# Patient Record
Sex: Male | Born: 1945 | Race: White | Hispanic: No | Marital: Married | Smoking: Former smoker
Health system: Southern US, Community
[De-identification: ages and names within clinical notes are randomized; demographics above are authoritative.]

## PROBLEM LIST (undated history)

## (undated) DIAGNOSIS — N433 Hydrocele, unspecified: Secondary | ICD-10-CM

## (undated) DIAGNOSIS — Z9989 Dependence on other enabling machines and devices: Secondary | ICD-10-CM

## (undated) DIAGNOSIS — E119 Type 2 diabetes mellitus without complications: Secondary | ICD-10-CM

## (undated) DIAGNOSIS — F419 Anxiety disorder, unspecified: Secondary | ICD-10-CM

## (undated) DIAGNOSIS — M1990 Unspecified osteoarthritis, unspecified site: Secondary | ICD-10-CM

## (undated) DIAGNOSIS — G4733 Obstructive sleep apnea (adult) (pediatric): Secondary | ICD-10-CM

## (undated) DIAGNOSIS — I10 Essential (primary) hypertension: Secondary | ICD-10-CM

## (undated) DIAGNOSIS — F32A Depression, unspecified: Secondary | ICD-10-CM

## (undated) DIAGNOSIS — E669 Obesity, unspecified: Secondary | ICD-10-CM

## (undated) DIAGNOSIS — E785 Hyperlipidemia, unspecified: Secondary | ICD-10-CM

## (undated) DIAGNOSIS — F329 Major depressive disorder, single episode, unspecified: Secondary | ICD-10-CM

## (undated) DIAGNOSIS — J690 Pneumonitis due to inhalation of food and vomit: Secondary | ICD-10-CM

## (undated) DIAGNOSIS — C801 Malignant (primary) neoplasm, unspecified: Secondary | ICD-10-CM

## (undated) DIAGNOSIS — D494 Neoplasm of unspecified behavior of bladder: Secondary | ICD-10-CM

## (undated) DIAGNOSIS — D649 Anemia, unspecified: Secondary | ICD-10-CM

## (undated) HISTORY — PX: EYE SURGERY: SHX253

## (undated) HISTORY — PX: COLONOSCOPY: SHX174

## (undated) HISTORY — PX: LAMINECTOMY: SHX219

---

## 2007-05-06 ENCOUNTER — Ambulatory Visit: Payer: Self-pay | Admitting: Pulmonary Disease

## 2007-05-31 ENCOUNTER — Ambulatory Visit (HOSPITAL_BASED_OUTPATIENT_CLINIC_OR_DEPARTMENT_OTHER): Admission: RE | Admit: 2007-05-31 | Discharge: 2007-05-31 | Payer: Self-pay | Admitting: Pulmonary Disease

## 2007-06-11 ENCOUNTER — Ambulatory Visit: Payer: Self-pay | Admitting: Pulmonary Disease

## 2007-07-01 ENCOUNTER — Ambulatory Visit: Payer: Self-pay | Admitting: Pulmonary Disease

## 2007-07-29 ENCOUNTER — Ambulatory Visit: Payer: Self-pay | Admitting: Pulmonary Disease

## 2007-09-16 ENCOUNTER — Ambulatory Visit: Payer: Self-pay | Admitting: Pulmonary Disease

## 2010-09-26 ENCOUNTER — Encounter: Admission: RE | Admit: 2010-09-26 | Discharge: 2010-09-26 | Payer: Self-pay | Admitting: Family Medicine

## 2011-04-21 NOTE — Assessment & Plan Note (Signed)
[facility]                             PULMONARY OFFICE NOTE   NAME:Trevor David, Trevor David                           MRN:          [id number]  DATE:05/06/2007                            DOB:          1946/09/26    REFERRING PHYSICIAN:  Jetty Duhamel., M.D.   HISTORY OF PRESENT ILLNESS:  The patient is a very pleasant, 65 year old  gentleman who I have been asked to see for obstructive sleep apnea. The  patient states that he has had great difficulty over the last few years  with excessive daytime sleepiness and it has gotten to the point that he  is now falling asleep at work during quiet times and even occasionally  while standing. The patient has significantly decreased energy. He also  states it is almost impossible for him to finish a TV program or a movie  without falling asleep, and also has some sleepiness with driving. The  patient has been told by his wife that he has loud snoring but she has  not mentioned pauses in his breathing during sleep. He has noted a few  choking arousals. Typically he gets to bed between 10 and 11 p.m. and  gets up at 7:45 a.m. to start his day. He is not rested. Of note, the  patient's weight is up about 25 pounds over the last 2 years.   PAST MEDICAL HISTORY:  1. Diabetes.  2. History of anxiety and ADD and is followed by Dr. Jennelle Human.  3. Status post lumbar laminectomy.  4. Status post cataract extraction.   CURRENT MEDICATIONS:  1. Crestor 5 mg daily.  2. Actos 45 mg daily.  3. Citalopram 40 mg daily.  4. Provigil 200 mg daily.  5. Doxazosin 8 mg 2 daily.  6. The patient has no known drug allergies.   SOCIAL HISTORY:  He is married and has children. He has a history of  only 1 pack per day for 23 years. He has not smoked since February 2008.   FAMILY HISTORY:  Remarkable for his mother and father having heart  attacks, otherwise noncontributory.   REVIEW OF SYSTEMS:  As per history of present illness. Also see  patient  intake form documented in the chart.   PHYSICAL EXAMINATION:  GENERAL:  He is an overweight male in no acute  distress.  VITAL SIGNS:  Blood pressure is 124/72, pulse 74, temperature 98.1,  weight is 246 pounds. He is 5 foot 8 inches tall. O2 saturation is 96%  on room air.  HEENT:  Pupils equal round and reactive to light and accommodation.  Extraocular muscles are intact. Nares are patent without discharge.  Oropharynx does show moderate elongation of the soft palate and uvula.  NECK:  Supple JVD or lymphadenopathy. There is no palpable thyromegaly.  CHEST:  Totally clear.  CARDIAC:  Reveals a regular rate and rhythm with no murmurs, rubs or  gallops.  ABDOMEN:  Soft, nontender with good bowel sounds.  GENITAL/RECTAL/BREASTS:  Not done and not indicated.  EXTREMITIES:  The lower extremities are without edema, pulses are intact  distally.  NEUROLOGIC:  He is alert and oriented with no obvious motor deficits.   IMPRESSION:  Probable obstructive sleep apnea that is fairly  significant. The patient gives all of the classic history, and he is  also overweight. I had a long discussion with him about sleep apnea  including the shortterm quality of life issues and the long-term  cardiovascular issues. At this point in time, I really think he needs to  have nocturnal polysomnography. The patient is agreeable to this.   PLAN:  1. Work on weight loss.  2. Scheduled for NPSG.  3. The patient will followup after the above.     Barbaraann Share, MD,FCCP  Electronically Signed    [facility]  DD: 05/06/2007  DT: 05/06/2007  Job #: [id number]   cc:   Jetty Duhamel., M.D.  Alfonse Alpers. Dagoberto Ligas, M.D.

## 2011-04-21 NOTE — Assessment & Plan Note (Signed)
[facility]                             PULMONARY OFFICE NOTE   NAME:Trevor David, Trevor David                           MRN:          [id number]  DATE:07/01/2007                            DOB:          11-Jan-1946    SUBJECTIVE:  Trevor David comes in today for followup of his nocturnal  polysomnogram.  He was found to have severe obstructive sleep apnea with  an apnea-hypopnea index of 86 events per hour and O2 desaturation as low  as 74%.  I have gone over the study in great detail with him and  answered all of his questions.   PHYSICAL EXAMINATION:  GENERAL:  He is an overweight male in no acute  distress.  Blood pressure is 138/72, pulse 78, temperature 98.5, weight  is 245 pounds, O2 saturation on room air is 96%.   IMPRESSION:  Severe obstructive sleep apnea.  I have had a long  discussion with him about the various treatment options for this,  however weight loss, continuous positive airway pressure are the only  effective therapies at this time.  The patient is willing to give  continuous positive airway pressure a shot while he is trying to lose  weight.   PLAN:  1. Initiate CPAP at 10 cm.  He will ultimately need pressure      optimization.  2. Work on weight loss.  3. The patient will follow up in 4 weeks or sooner is there is      problems.     Barbaraann Share, MD,FCCP  Electronically Signed    [facility]  DD: 07/01/2007  DT: 07/02/2007  Job #: [id number]   cc:   Jetty Duhamel., M.D.  Alfonse Alpers. Dagoberto Ligas, M.D.

## 2011-04-21 NOTE — Procedures (Signed)
NAME:  Trevor David, Trevor David NO.:  [id number]   MEDICAL RECORD NO.:  [id number]          PATIENT TYPE:  OUT   LOCATION:  SLEEP CENTER                 FACILITY:  [facility]   PHYSICIAN:  Barbaraann Share, MD,FCCPDATE OF BIRTH:  October 11, 1946   DATE OF STUDY:  05/31/2007                            NOCTURNAL POLYSOMNOGRAM   REFERRING PHYSICIAN:  Barbaraann Share, MD,FCCP   LOCATION:  Sleep Lab.   INDICATION FOR STUDY:  Hypersomnia with sleep apnea.   EPWORTH SLEEPINESS SCORE:  23.   MEDICATIONS:   SLEEP ARCHITECTURE:  The patient had a total sleep time of 293 minutes  with no slow-wave sleep achieved and only 12.5 minutes of REM.  Sleep  onset latency was prolonged at 65 minutes and REM onset was normal.  Sleep efficiency was decreased at 72%.   RESPIRATORY DATA:  The patient was found to have 123 hypopneas and 298  apneas for an apnea-hypopnea index of 86 events per hour.  The events  were not positional and there was loud snoring noted throughout.   OXYGEN DATA:  The patient had O2 desaturation with his obstructive  events as low as 74%.   CARDIAC DATA:  No clinically significant arrhythmias were noted.   MOVEMENT-PARASOMNIA:  None.   IMPRESSIONS-RECOMMENDATIONS:  Severe obstructive sleep apnea/hypopnea  syndrome with an apnea-hypopnea index of 86 events per hour and oxygen  desaturation as low as 84%.  Treatment for this degree of sleep apnea  should focus on weight loss if applicable as well as continuous positive  airway pressure.      Barbaraann Share, MD,FCCP  Diplomate, American Board of Sleep  Medicine  Electronically Signed    [facility]  D:  06/09/2007 18:52:30  T:  06/10/2007 10:55:15  Job:  [id number]

## 2013-12-07 HISTORY — PX: ROTATOR CUFF REPAIR: SHX139

## 2014-10-11 ENCOUNTER — Other Ambulatory Visit: Payer: Self-pay | Admitting: Family Medicine

## 2014-10-11 ENCOUNTER — Ambulatory Visit
Admission: RE | Admit: 2014-10-11 | Discharge: 2014-10-11 | Disposition: A | Discharge status: Home / Self Care | Payer: BC Managed Care – PPO | Source: Ambulatory Visit | Attending: Family Medicine | Admitting: Family Medicine

## 2014-10-11 DIAGNOSIS — J40 Bronchitis, not specified as acute or chronic: Secondary | ICD-10-CM

## 2014-10-23 ENCOUNTER — Other Ambulatory Visit: Payer: Self-pay | Admitting: Family Medicine

## 2014-10-23 DIAGNOSIS — R0989 Other specified symptoms and signs involving the circulatory and respiratory systems: Secondary | ICD-10-CM

## 2014-10-31 ENCOUNTER — Ambulatory Visit
Admission: RE | Admit: 2014-10-31 | Discharge: 2014-10-31 | Disposition: A | Discharge status: Home / Self Care | Payer: BC Managed Care – PPO | Source: Ambulatory Visit | Attending: Family Medicine | Admitting: Family Medicine

## 2014-10-31 DIAGNOSIS — R0989 Other specified symptoms and signs involving the circulatory and respiratory systems: Secondary | ICD-10-CM

## 2014-12-05 ENCOUNTER — Other Ambulatory Visit: Payer: Self-pay | Admitting: Family Medicine

## 2014-12-05 ENCOUNTER — Ambulatory Visit
Admission: RE | Admit: 2014-12-05 | Discharge: 2014-12-05 | Disposition: A | Discharge status: Home / Self Care | Payer: BC Managed Care – PPO | Source: Ambulatory Visit | Attending: Family Medicine | Admitting: Family Medicine

## 2014-12-05 DIAGNOSIS — J189 Pneumonia, unspecified organism: Secondary | ICD-10-CM

## 2017-03-01 ENCOUNTER — Encounter: Payer: Self-pay | Admitting: Internal Medicine

## 2017-03-01 ENCOUNTER — Ambulatory Visit (INDEPENDENT_AMBULATORY_CARE_PROVIDER_SITE_OTHER): Payer: BC Managed Care – PPO | Admitting: Internal Medicine

## 2017-03-01 VITALS — BP 118/74 | HR 72 | Ht 68.0 in | Wt 262.3 lb

## 2017-03-01 DIAGNOSIS — G4726 Circadian rhythm sleep disorder, shift work type: Secondary | ICD-10-CM

## 2017-03-01 DIAGNOSIS — G4733 Obstructive sleep apnea (adult) (pediatric): Secondary | ICD-10-CM | POA: Diagnosis not present

## 2017-03-01 DIAGNOSIS — E6609 Other obesity due to excess calories: Secondary | ICD-10-CM

## 2017-03-01 NOTE — Patient Instructions (Signed)
Order- schedule sleep study- unattended home study     Dx OSA  Please call as needed

## 2017-03-01 NOTE — Progress Notes (Signed)
03/01/2017- 71 year old male married smoker with history of Obstructive sleep apnea -[facility] patient of [place]; had attempted to use CPAP in the past-unable to tolerate-mask fitting issues. Now referred back by Dr. Lynder Parents who has help with anxiety.   Medical problems include DM 2,  NPSG 05/31/07- severe OSA-AHI 86/hour, oxygen desaturation to 84%. Weight 245 pounds Initial CPAP trial was at 10 CWP Plans to retire in August but has worked a changing shift schedule which has contributed to his difficulties. 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 For complaints of "low energy and falling asleep" he had tried Ritalin and Adderall in the past. He works alternating shifts. Works 7 AM until 12 PM Told he snores loudly and raises his arm and his sleep. ENT surgery-none. He is not sure thyroid status. History of back pain requiring laminectomy. Uses occasional OTC sleep aid-NyQuil or Zzquil. Caffeine 2 or 3 cups in the morning.  Prior to Admission medications   Medication Sig Start Date End Date Taking? Authorizing Provider  Armodafinil 250 MG tablet Take 1 tablet by mouth daily. 01/11/17  Yes Historical Provider, MD  citalopram (CELEXA) 20 MG tablet Take 1 tablet by mouth daily. 12/29/16  Yes Historical Provider, MD  doxazosin (CARDURA) 8 MG tablet Take 1 tablet by mouth daily. 02/23/17  Yes Historical Provider, MD  JANUMET 50-1000 MG tablet Take 1 tablet by mouth 2 (two) times daily. 02/08/17  Yes Historical Provider, MD  lisinopril (PRINIVIL,ZESTRIL) 5 MG tablet Take 1 tablet by mouth daily. 01/30/17  Yes Historical Provider, MD  pioglitazone (ACTOS) 45 MG tablet Take 1 tablet by mouth daily. 01/30/17  Yes Historical Provider, MD  rosuvastatin (CRESTOR) 5 MG tablet Take 1 tablet by mouth daily. 02/08/17  Yes Historical Provider, MD   No past medical history on file. No past surgical history on  file. No family history on file. Social History   Social History  . Marital status: Married    Spouse name: N/A  . Number of children: N/A  . Years of education: N/A   Occupational History  . Not on file.   Social History Main Topics  . Smoking status: Current Every Day Smoker    Packs/day: 0.50    Types: Cigarettes  . Smokeless tobacco: Never Used  . Alcohol use Not on file  . Drug use: Unknown  . Sexual activity: Not on file   Other Topics Concern  . Not on file   Social History Narrative  . No narrative on file   ROS-see HPI   Negative unless "+" Constitutional:    weight loss, night sweats, fevers, chills, + fatigue, lassitude. HEENT:    headaches, difficulty swallowing, tooth/dental problems, sore throat,       sneezing, itching, ear ache, nasal congestion, post nasal drip, snoring CV:    chest pain, orthopnea, PND, swelling in lower extremities, anasarca,                                                 dizziness, palpitations Resp:   shortness of breath with exertion or at rest.                productive cough,   non-productive cough, coughing up of blood.  change in color of mucus.  wheezing.   Skin:    rash or lesions. GI:  No-   heartburn, indigestion, abdominal pain, nausea, vomiting, diarrhea,                 change in bowel habits, loss of appetite GU: dysuria, change in color of urine, no urgency or frequency.   flank pain. MS:   joint pain, stiffness, decreased range of motion, back pain. Neuro-     nothing unusual Psych:  change in mood or affect.  depression or anxiety.   memory loss.  OBJ- Physical Exam           + obese General- Alert, Oriented, Affect-appropriate, Distress- none acute Skin- rash-none, lesions- none, excoriation- none Lymphadenopathy- none Head- atraumatic            Eyes- Gross vision intact, PERRLA, conjunctivae and secretions clear            Ears- Hearing, canals-normal            Nose- Clear, no-Septal dev, mucus,  polyps, erosion, perforation             Throat- Mallampati II-III , mucosa clear , drainage- none, tonsils- atrophic Neck- flexible , trachea midline, no stridor , thyroid nl, carotid no bruit Chest - symmetrical excursion , unlabored           Heart/CV- RRR , no murmur , no gallop  , no rub, nl s1 s2                           - JVD- none , edema- none, stasis changes- none, varices- none           [place]- clear to P&A, wheeze- none, cough- none , dullness-none, rub- none           Chest wall-  Abd-  Br/ Gen/ Rectal- Not done, not indicated Extrem- cyanosis- none, clubbing, none, atrophy- none, strength- nl Neuro- grossly intact to observation

## 2017-03-06 DIAGNOSIS — E669 Obesity, unspecified: Secondary | ICD-10-CM | POA: Insufficient documentation

## 2017-03-06 DIAGNOSIS — G4733 Obstructive sleep apnea (adult) (pediatric): Secondary | ICD-10-CM | POA: Insufficient documentation

## 2017-03-06 DIAGNOSIS — G4726 Circadian rhythm sleep disorder, shift work type: Secondary | ICD-10-CM | POA: Insufficient documentation

## 2017-03-06 NOTE — Assessment & Plan Note (Signed)
He works long/late shifts when he is on. This is likely to contribute to fatigue as discussed.

## 2017-03-06 NOTE — Assessment & Plan Note (Signed)
Goal of normal weight was discussed as ordered for control of sleep apnea. He would also significantly help his diabetes.

## 2017-03-06 NOTE — Assessment & Plan Note (Signed)
We need to document current status. We discussed CPAP with neuro-machines and masks as an anticipated first option. Other considerations will be based on  sleep study and further discussion. Importance of weight loss and his responsibility to be safe driving or emphasized.

## 2017-03-15 DIAGNOSIS — G4733 Obstructive sleep apnea (adult) (pediatric): Secondary | ICD-10-CM | POA: Diagnosis not present

## 2017-03-23 DIAGNOSIS — G4733 Obstructive sleep apnea (adult) (pediatric): Secondary | ICD-10-CM | POA: Diagnosis not present

## 2017-03-24 ENCOUNTER — Other Ambulatory Visit: Payer: Self-pay | Admitting: *Deleted

## 2017-03-24 DIAGNOSIS — G4733 Obstructive sleep apnea (adult) (pediatric): Secondary | ICD-10-CM

## 2017-03-29 ENCOUNTER — Ambulatory Visit: Payer: BC Managed Care – PPO | Admitting: Internal Medicine

## 2017-03-31 ENCOUNTER — Encounter: Payer: Self-pay | Admitting: Internal Medicine

## 2017-03-31 ENCOUNTER — Ambulatory Visit (INDEPENDENT_AMBULATORY_CARE_PROVIDER_SITE_OTHER): Payer: BC Managed Care – PPO | Admitting: Internal Medicine

## 2017-03-31 VITALS — BP 130/78 | HR 77 | Ht 68.0 in | Wt 259.4 lb

## 2017-03-31 DIAGNOSIS — G4733 Obstructive sleep apnea (adult) (pediatric): Secondary | ICD-10-CM

## 2017-03-31 DIAGNOSIS — G4726 Circadian rhythm sleep disorder, shift work type: Secondary | ICD-10-CM

## 2017-03-31 DIAGNOSIS — Z6835 Body mass index (BMI) 35.0-35.9, adult: Secondary | ICD-10-CM

## 2017-03-31 MED ORDER — ZALEPLON 5 MG PO CAPS: ORAL_CAPSULE | ORAL | 3 refills | Status: DC

## 2017-03-31 NOTE — Assessment & Plan Note (Signed)
This is linked to his job and backward change soon. Insomnia component was discussed. Sleep hygiene education done. Plan-try a short-half-life sleep aid: Sonata with appropriate usage discussed.

## 2017-03-31 NOTE — Assessment & Plan Note (Signed)
Weight loss would help significantly. Hopefully with better control of sleep status he may find opportunity to make the appropriate lifestyle changes.

## 2017-03-31 NOTE — Patient Instructions (Signed)
Order- new DME   New CPAP   Auto 5-20, mask of choice, humidifier, supplies, AirView  Dx OSA  Script printed for Sonata 5 mg to take 1 or 2 caps a little before bedtime for sleep as needed  Please call as needed
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
## 2017-03-31 NOTE — Assessment & Plan Note (Signed)
Severe obstructive sleep apnea. Education done. Plan-CPAP with auto 5-20

## 2017-05-13 ENCOUNTER — Encounter: Payer: Self-pay | Admitting: Internal Medicine

## 2017-05-27 ENCOUNTER — Ambulatory Visit
Admission: RE | Admit: 2017-05-27 | Discharge: 2017-05-27 | Disposition: A | Discharge status: Home / Self Care | Payer: BC Managed Care – PPO | Source: Ambulatory Visit | Attending: Family Medicine | Admitting: Family Medicine

## 2017-05-27 ENCOUNTER — Other Ambulatory Visit: Payer: Self-pay | Admitting: Family Medicine

## 2017-05-27 DIAGNOSIS — R05 Cough: Secondary | ICD-10-CM

## 2017-05-27 DIAGNOSIS — R059 Cough, unspecified: Secondary | ICD-10-CM

## 2017-06-08 ENCOUNTER — Ambulatory Visit: Payer: BC Managed Care – PPO | Admitting: Internal Medicine

## 2017-06-30 ENCOUNTER — Ambulatory Visit: Payer: BC Managed Care – PPO | Admitting: Internal Medicine

## 2017-07-12 ENCOUNTER — Encounter: Payer: Self-pay | Admitting: Internal Medicine

## 2017-09-19 ENCOUNTER — Encounter: Payer: Self-pay | Admitting: Internal Medicine

## 2017-09-20 ENCOUNTER — Encounter: Payer: Self-pay | Admitting: Internal Medicine

## 2017-09-20 ENCOUNTER — Ambulatory Visit (INDEPENDENT_AMBULATORY_CARE_PROVIDER_SITE_OTHER): Payer: Medicare Other | Admitting: Internal Medicine

## 2017-09-20 DIAGNOSIS — Z23 Encounter for immunization: Secondary | ICD-10-CM | POA: Diagnosis not present

## 2017-09-20 DIAGNOSIS — G4733 Obstructive sleep apnea (adult) (pediatric): Secondary | ICD-10-CM | POA: Diagnosis not present

## 2017-09-20 DIAGNOSIS — G4726 Circadian rhythm sleep disorder, shift work type: Secondary | ICD-10-CM | POA: Diagnosis not present

## 2017-09-20 MED ORDER — CLONAZEPAM 0.5 MG PO TABS: ORAL_TABLET | ORAL | 2 refills | Status: DC

## 2017-09-20 NOTE — Assessment & Plan Note (Signed)
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

## 2017-09-20 NOTE — Assessment & Plan Note (Addendum)
He is never been easy and comfortable with CPAP. We discussed comfort issues and goals. Shift work is no longer part of the problem. Plan-continue CPAP auto 5-20/ Advanced Emphasis on comfort. Try clonazepam instead of Sonata for insomnia component.
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
## 2017-09-20 NOTE — Patient Instructions (Signed)
Script printed for clonazepam      Instead of Sonata, try 1-2 tabs about 30 minutes before  bedtime, for sleep as needed   Please try to get the comfort issues with CPAP worked out.- We can work with more mask styles if that will help  Flu vax- senior

## 2017-09-22 DIAGNOSIS — H401131 Primary open-angle glaucoma, bilateral, mild stage: Secondary | ICD-10-CM | POA: Diagnosis not present

## 2017-09-22 DIAGNOSIS — H524 Presbyopia: Secondary | ICD-10-CM | POA: Diagnosis not present

## 2017-09-22 DIAGNOSIS — H26492 Other secondary cataract, left eye: Secondary | ICD-10-CM | POA: Diagnosis not present

## 2017-09-22 DIAGNOSIS — H5213 Myopia, bilateral: Secondary | ICD-10-CM | POA: Diagnosis not present

## 2017-09-22 DIAGNOSIS — H52223 Regular astigmatism, bilateral: Secondary | ICD-10-CM | POA: Diagnosis not present

## 2017-09-22 DIAGNOSIS — E119 Type 2 diabetes mellitus without complications: Secondary | ICD-10-CM | POA: Diagnosis not present

## 2018-01-12 DIAGNOSIS — F419 Anxiety disorder, unspecified: Secondary | ICD-10-CM | POA: Diagnosis not present

## 2018-01-19 DIAGNOSIS — E119 Type 2 diabetes mellitus without complications: Secondary | ICD-10-CM | POA: Diagnosis not present

## 2018-01-19 DIAGNOSIS — F324 Major depressive disorder, single episode, in partial remission: Secondary | ICD-10-CM | POA: Diagnosis not present

## 2018-01-19 DIAGNOSIS — E7800 Pure hypercholesterolemia, unspecified: Secondary | ICD-10-CM | POA: Diagnosis not present

## 2018-01-19 DIAGNOSIS — I10 Essential (primary) hypertension: Secondary | ICD-10-CM | POA: Diagnosis not present

## 2018-01-20 ENCOUNTER — Encounter: Payer: Self-pay | Admitting: Internal Medicine

## 2018-01-21 ENCOUNTER — Ambulatory Visit (INDEPENDENT_AMBULATORY_CARE_PROVIDER_SITE_OTHER): Payer: Medicare Other | Admitting: Internal Medicine

## 2018-01-21 ENCOUNTER — Encounter: Payer: Self-pay | Admitting: Internal Medicine

## 2018-01-21 DIAGNOSIS — Z720 Tobacco use: Secondary | ICD-10-CM | POA: Insufficient documentation

## 2018-01-21 DIAGNOSIS — G4733 Obstructive sleep apnea (adult) (pediatric): Secondary | ICD-10-CM | POA: Diagnosis not present
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
## 2018-01-21 NOTE — Assessment & Plan Note (Signed)
We reviewed compliance download.  We think he can do better as discussed.  Pressure range is fine.  He is going to check his manual for instructions to adjust his humidifier for comfort. Plan-continue auto 5-20.  Adjust humidifier.  Encourage weight loss.  Replace old mask for better seal.

## 2018-01-21 NOTE — Assessment & Plan Note (Signed)
Despite clear chest x-ray and lack of symptoms, I explained his continued smoking is likely to cause him problems and he is strongly encouraged to quit completely.

## 2018-01-21 NOTE — Assessment & Plan Note (Signed)
Weight loss is encouraged 

## 2018-01-21 NOTE — Patient Instructions (Signed)
Ok to continue CPAP auto 5-20, mask of choice, humidifier, supplies, AirView  Please call if we can help

## 2018-01-25 DIAGNOSIS — H401131 Primary open-angle glaucoma, bilateral, mild stage: Secondary | ICD-10-CM | POA: Diagnosis not present

## 2018-02-22 DIAGNOSIS — R300 Dysuria: Secondary | ICD-10-CM | POA: Diagnosis not present

## 2018-03-01 DIAGNOSIS — D485 Neoplasm of uncertain behavior of skin: Secondary | ICD-10-CM | POA: Diagnosis not present

## 2018-03-01 DIAGNOSIS — L570 Actinic keratosis: Secondary | ICD-10-CM | POA: Diagnosis not present

## 2018-03-01 DIAGNOSIS — L821 Other seborrheic keratosis: Secondary | ICD-10-CM | POA: Diagnosis not present

## 2018-03-01 DIAGNOSIS — L218 Other seborrheic dermatitis: Secondary | ICD-10-CM | POA: Diagnosis not present

## 2018-03-04 DIAGNOSIS — R310 Gross hematuria: Secondary | ICD-10-CM | POA: Diagnosis not present

## 2018-03-11 DIAGNOSIS — R310 Gross hematuria: Secondary | ICD-10-CM | POA: Diagnosis not present

## 2018-03-11 DIAGNOSIS — R3129 Other microscopic hematuria: Secondary | ICD-10-CM | POA: Diagnosis not present

## 2018-03-16 DIAGNOSIS — D414 Neoplasm of uncertain behavior of bladder: Secondary | ICD-10-CM | POA: Diagnosis not present

## 2018-03-16 DIAGNOSIS — R310 Gross hematuria: Secondary | ICD-10-CM | POA: Diagnosis not present

## 2018-03-23 ENCOUNTER — Other Ambulatory Visit: Payer: Self-pay | Admitting: Urology

## 2018-04-08 ENCOUNTER — Encounter (HOSPITAL_BASED_OUTPATIENT_CLINIC_OR_DEPARTMENT_OTHER): Payer: Self-pay

## 2018-04-11 ENCOUNTER — Encounter (HOSPITAL_BASED_OUTPATIENT_CLINIC_OR_DEPARTMENT_OTHER): Payer: Self-pay

## 2018-04-11 ENCOUNTER — Other Ambulatory Visit: Payer: Self-pay

## 2018-04-11 NOTE — Progress Notes (Signed)
Spoke with:  Jeneen Rinks NPO:  After Midnight, no gum, candy, or mints  No smoking Arrival time:  0930AM Labs: Istat8, EKG AM medications: Citalopram, Rosuvastatin, Nuvigil Pre op orders: Yes Ride home:  [name] (wife) [phone]

## 2018-04-14 NOTE — H&P (Signed)
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  He first noticed the symptoms approximately 02/18/2018. He did see the blood in his urine. He has seen blood clots.   He does not have a burning sensation when he urinates. He is not currently having trouble urinating.   He has not had kidney stones. He is not having pain. He has not recently had unwanted weight loss.   03/04/18: He reports that he 1st started having some mild urethral discomfort and cloudy discoloration of the urine about 2 weeks ago. He was started on empiric antibiotics and a culture was found to be negative. He recently had a few clots pass at the initiation of his urinary stream. That occurred again this morning. He said he has over the years had some mild urgency and occasional episodes of urge incontinence but he says this is intermittent and he does not get up at night to urinate. He takes a daily aspirin. He has been a cigarette smoker and continues to smoke. There is no family history of GU malignancy. 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   03/16/18: He said he has seen a little bit more blood in the urine. He has undergone upper tract evaluation of the CT scan in returns today for completion of his evaluation with cystoscopy.     ALLERGIES: None   MEDICATIONS: Cardura 8 mg tablet  Crestor 5 mg tablet  Lisinopril 5 mg tablet  Celexa 20 mg tablet  Janumet 50 mg-1,000 mg tablet     GU PSH: Locm 300-399Mg /Ml Iodine,1Ml - 03/11/2018    NON-GU PSH: Lumbar Laminectomy, Right Rotator Cuff Surgery.., Left    GU PMH: Encounter for Prostate Cancer screening, His prostate was noted to be benign. It has been 2 years since his last PSA so that will be checked as well today. - 03/04/2018 Gross hematuria, He has experienced gross hematuria and has been and continues to be a cigarette smoker. I therefore have recommended a full hematuria evaluation with upper tract  imaging and cystoscopy. I will check a creatinine today. - 03/04/2018    NON-GU PMH: Anxiety Depression Diabetes Type 2 Glaucoma Gout Sleep Apnea    FAMILY HISTORY: Myocardial Infarction - Mother, Father   SOCIAL HISTORY: Marital Status: Married Preferred Language: English; Ethnicity: Not Hispanic Or Latino; Race: White Current Smoking Status: Patient smokes. Has smoked since 02/05/1968. Smokes 1 pack per day.   Tobacco Use Assessment Completed: Used Tobacco in last 30 days? Drinks 4+ caffeinated drinks per day.    REVIEW OF SYSTEMS:    GU Review Male:   Patient denies frequent urination, hard to postpone urination, burning/ pain with urination, get up at night to urinate, leakage of urine, stream starts and stops, trouble starting your stream, have to strain to urinate , erection problems, and penile pain.  Gastrointestinal (Upper):   Patient denies nausea, vomiting, and indigestion/ heartburn.  Gastrointestinal (Lower):   Patient denies diarrhea and constipation.  Constitutional:   Patient denies fever, night sweats, weight loss, and fatigue.  Skin:   Patient denies skin rash/ lesion and itching.  Eyes:   Patient denies blurred vision and double vision.  Ears/ Nose/ Throat:   Patient denies sore throat and sinus problems.  Hematologic/Lymphatic:   Patient denies swollen glands and easy bruising.  Cardiovascular:   Patient denies leg swelling and chest pains.  Respiratory:   Patient denies cough and shortness of breath.  Endocrine:  Patient denies excessive thirst.  Musculoskeletal:   Patient denies back pain and joint pain.  Neurological:   Patient denies headaches and dizziness.  Psychologic:   Patient denies depression and anxiety.   VITAL SIGNS:      03/16/2018 09:25 AM  Weight 255 lb / 115.67 kg  Height 68 in / 172.72 cm  BP 131/62 mmHg  Pulse 80 /min  BMI 38.8 kg/m   GU PHYSICAL EXAMINATION:    Urethral Meatus: Normal size. No lesion, no wart, no discharge, no  polyp. Normal location.  Penis: Circumcised, no warts, no cracks. No dorsal Peyronie's plaques, no left corporal Peyronie's plaques, no right corporal Peyronie's plaques, no scarring, no warts. No balanitis, no meatal stenosis.   MULTI-SYSTEM PHYSICAL EXAMINATION:       PAST DATA REVIEWED:  Source Of History:  Patient  Lab Test Review:   BUN/Creatinine  Records Review:   Previous Patient Records, POC Tool  X-Ray Review: C.T. Hematuria: Reviewed Films. Reviewed Report. Discussed With Patient. EXAM: CT ABDOMEN AND PELVIS WITHOUT AND WITH CONTRAST TECHNIQUE: Multidetector CT imaging of the abdomen and pelvis was performed following the standard protocol before and following the bolus administration of intravenous contrast. CONTRAST: 125 cc Isovue-300 COMPARISON: None. FINDINGS: Lower chest: Linear scarring or subsegmental atelectasis in the lingula and posterior basal segment right lower lobe. Right coronary artery atherosclerotic calcification. Hepatobiliary: Punctate calcifications in the left hepatic lobe compatible with remote granulomatous disease. Gallbladder unremarkable. No biliary dilatation. Pancreas: Unremarkable Spleen: Punctate calcifications compatible with old granulomatous disease. Adrenals/Urinary Tract: Adrenal glands normal. No urinary tract calculi are observed. No appreciable abnormal renal parenchymal enhancement. Polypoid 1.4 by 1.2 by 1.1 cm filling defect in the left posterior urinary bladder just above the left UPJ appears to have accentuated enhancement on image 71/5, favoring transitional cell carcinoma. No other filling defect along the urothelium is identified. Stomach/Bowel: The duodenum does not promptly cross the midline and distally does not extend up to the level of the duodenal bulb, appearance favoring malrotation. Orientation of the colon appears normal. Clustered ascending colon diverticula. Sigmoid colon diverticulosis. No dilated bowel. Vascular/Lymphatic: Aortoiliac  atherosclerotic vascular disease. No pathologic pelvic adenopathy is observed. Reproductive: Unremarkable Other: No supplemental non-categorized findings. Musculoskeletal: Fused sacroiliac joints. Lower lumbar spondylosis with bridging fusion at L5-S1 and bilateral foraminal impingement at L5-S1 secondary to spurring. IMPRESSION: 1. 1.4 cm polypoid mass along the left posterior urinary bladder appears to enhance. Appearance favors transitional cell carcinoma. No other filling defects along the urothelium identified. 2. Other imaging findings of potential clinical significance: Old granulomatous disease. 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 Sigmoid colon diverticulosis and clustered diverticula also in the ascending colon. Aortic Atherosclerosis (ICD10-I70.0). Fused sacroiliac joints. Bilateral foraminal impingement at L5-S1 due to spurring.     03/04/18  PSA  Total PSA 0.16 ng/mL    03/04/18  General Chemistry  Creatinine 0.7 mg/dL   PROCEDURES:         Flexible Cystoscopy - done 03/16/18 Risks, benefits, and some of the potential complications of the procedure were discussed at length with the patient including infection, bleeding, voiding discomfort, urinary retention, fever, chills, sepsis, and others. All questions were answered. Informed consent was obtained. Sterile technique and 2% Lidocaine intraurethral analgesia were used.  Meatus:  Normal size. Normal location. Normal condition.  Urethra:  No strictures.  External Sphincter:  Normal.  Verumontanum:  Normal.  Prostate:  Borderline obstructing. Mild hyperplasia.  Bladder Neck:  Non-obstructing.  Ureteral Orifices:  Normal location. Normal  size. Normal shape. Effluxed clear urine.  Bladder:  Mild trabeculation. 1 1/2 cm tumor. Solitary tumor. Normal mucosa. No stones.      The lower urinary tract was carefully examined. The procedure was well-tolerated and without complications. Instructions were given to call the  office immediately for bloody urine, difficulty urinating, urinary retention, painful or frequent urination, fever or other illness. The patient stated that he understood these instructions and would comply with them.         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  2   Gross hematuria - R31.0 Stable - His hematuria is clearly secondary to the bladder tumor identified.          Notes:   I went over the results of the CT scan as well as my cystoscopic findings today which have revealed a bladder mass consistent with transitional cell carcinoma. We discussed the fact that currently there is no evidence of extravesical extension or pelvic adenopathy based on CT scan findings. Further characterization of the lesion is required for grading and staging purposes. We discussed proceeding with evaluation using transurethral resection of the lesion. I have discussed the procedure in detail as well as the potential risks and complications associated with this form of surgery. We also discussed the probability of successful resection of the intravesical portion of this lesion. I have recommended, as long as there is no contraindication at the time of surgery, the placement of intravesical mitomycin-C in order to reduce the risk of recurrence. We did discuss the  potential side effects of this form of intravesical chemotherapy. The procedure will be performed under anesthesia as an outpatient.

## 2018-04-15 ENCOUNTER — Ambulatory Visit (HOSPITAL_BASED_OUTPATIENT_CLINIC_OR_DEPARTMENT_OTHER): Payer: Medicare Other | Admitting: Certified Registered"

## 2018-04-15 ENCOUNTER — Encounter (HOSPITAL_BASED_OUTPATIENT_CLINIC_OR_DEPARTMENT_OTHER)
Admission: RE | Disposition: A | Discharge status: Home / Self Care | Payer: Self-pay | Source: Ambulatory Visit | Attending: Urology

## 2018-04-15 ENCOUNTER — Ambulatory Visit (HOSPITAL_BASED_OUTPATIENT_CLINIC_OR_DEPARTMENT_OTHER)
Admission: RE | Admit: 2018-04-15 | Discharge: 2018-04-15 | Disposition: A | Discharge status: Home / Self Care | Payer: Medicare Other | Source: Ambulatory Visit | Attending: Urology | Admitting: Urology

## 2018-04-15 ENCOUNTER — Encounter (HOSPITAL_BASED_OUTPATIENT_CLINIC_OR_DEPARTMENT_OTHER): Payer: Self-pay | Admitting: Certified Registered"

## 2018-04-15 DIAGNOSIS — G4730 Sleep apnea, unspecified: Secondary | ICD-10-CM | POA: Insufficient documentation

## 2018-04-15 DIAGNOSIS — G4733 Obstructive sleep apnea (adult) (pediatric): Secondary | ICD-10-CM | POA: Diagnosis not present

## 2018-04-15 DIAGNOSIS — Z7984 Long term (current) use of oral hypoglycemic drugs: Secondary | ICD-10-CM | POA: Insufficient documentation

## 2018-04-15 DIAGNOSIS — D494 Neoplasm of unspecified behavior of bladder: Secondary | ICD-10-CM | POA: Diagnosis not present

## 2018-04-15 DIAGNOSIS — Z6838 Body mass index (BMI) 38.0-38.9, adult: Secondary | ICD-10-CM | POA: Insufficient documentation

## 2018-04-15 DIAGNOSIS — F419 Anxiety disorder, unspecified: Secondary | ICD-10-CM | POA: Insufficient documentation

## 2018-04-15 DIAGNOSIS — E119 Type 2 diabetes mellitus without complications: Secondary | ICD-10-CM | POA: Diagnosis not present

## 2018-04-15 DIAGNOSIS — C679 Malignant neoplasm of bladder, unspecified: Secondary | ICD-10-CM | POA: Diagnosis not present

## 2018-04-15 DIAGNOSIS — Z79899 Other long term (current) drug therapy: Secondary | ICD-10-CM | POA: Diagnosis not present

## 2018-04-15 DIAGNOSIS — I10 Essential (primary) hypertension: Secondary | ICD-10-CM | POA: Insufficient documentation

## 2018-04-15 DIAGNOSIS — F17210 Nicotine dependence, cigarettes, uncomplicated: Secondary | ICD-10-CM | POA: Diagnosis not present

## 2018-04-15 DIAGNOSIS — F329 Major depressive disorder, single episode, unspecified: Secondary | ICD-10-CM | POA: Diagnosis not present

## 2018-04-15 DIAGNOSIS — C674 Malignant neoplasm of posterior wall of bladder: Secondary | ICD-10-CM | POA: Diagnosis not present

## 2018-04-15 HISTORY — PX: TRANSURETHRAL RESECTION OF BLADDER TUMOR: SHX2575

## 2018-04-15 HISTORY — DX: Hydrocele, unspecified: N43.3

## 2018-04-15 HISTORY — DX: Depression, unspecified: F32.A

## 2018-04-15 HISTORY — DX: Major depressive disorder, single episode, unspecified: F32.9

## 2018-04-15 HISTORY — DX: Pneumonitis due to inhalation of food and vomit: J69.0

## 2018-04-15 HISTORY — DX: Anemia, unspecified: D64.9

## 2018-04-15 HISTORY — DX: Obstructive sleep apnea (adult) (pediatric): G47.33

## 2018-04-15 HISTORY — DX: Neoplasm of unspecified behavior of bladder: D49.4

## 2018-04-15 HISTORY — DX: Type 2 diabetes mellitus without complications: E11.9

## 2018-04-15 HISTORY — DX: Dependence on other enabling machines and devices: Z99.89

## 2018-04-15 HISTORY — DX: Hyperlipidemia, unspecified: E78.5

## 2018-04-15 HISTORY — DX: Anxiety disorder, unspecified: F41.9

## 2018-04-15 HISTORY — DX: Unspecified osteoarthritis, unspecified site: M19.90

## 2018-04-15 HISTORY — DX: Obesity, unspecified: E66.9

## 2018-04-15 HISTORY — DX: Essential (primary) hypertension: I10

## 2018-04-15 LAB — POCT I-STAT, CHEM 8
BUN: 14 mg/dL (ref 6–20)
Calcium, Ion: 1.23 mmol/L (ref 1.15–1.40)
Chloride: 102 mmol/L (ref 101–111)
Creatinine, Ser: 0.8 mg/dL (ref 0.61–1.24)
Glucose, Bld: 112 mg/dL — ABNORMAL HIGH (ref 65–99)
HCT: 39 % (ref 39.0–52.0)
Hemoglobin: 13.3 g/dL (ref 13.0–17.0)
Potassium: 4.3 mmol/L (ref 3.5–5.1)
Sodium: 138 mmol/L (ref 135–145)
TCO2: 23 mmol/L (ref 22–32)

## 2018-04-15 LAB — GLUCOSE, CAPILLARY: Glucose-Capillary: 122 mg/dL — ABNORMAL HIGH (ref 65–99)

## 2018-04-15 SURGERY — TURBT (TRANSURETHRAL RESECTION OF BLADDER TUMOR)
Anesthesia: General

## 2018-04-15 MED ORDER — ACETAMINOPHEN 160 MG/5ML PO SOLN
325.0000 mg | ORAL | Status: DC | PRN
  Filled 2018-04-15: qty 20.3

## 2018-04-15 MED ORDER — FENTANYL CITRATE (PF) 100 MCG/2ML IJ SOLN
25.0000 ug | INTRAMUSCULAR | Status: DC | PRN
  Administered 2018-04-15: 25 ug via INTRAVENOUS
  Filled 2018-04-15: qty 1

## 2018-04-15 MED ORDER — LIDOCAINE 2% (20 MG/ML) 5 ML SYRINGE
INTRAMUSCULAR | Status: DC | PRN
  Administered 2018-04-15: 80 mg via INTRAVENOUS

## 2018-04-15 MED ORDER — SODIUM CHLORIDE 0.9 % IR SOLN
Status: DC | PRN
  Administered 2018-04-15: 3000 mL via INTRAVESICAL

## 2018-04-15 MED ORDER — PROPOFOL 10 MG/ML IV BOLUS: INTRAVENOUS | Status: DC | PRN

## 2018-04-15 MED ORDER — SODIUM CHLORIDE 0.9 % IJ SOLN
50.0000 mg | Freq: Once | INTRAVENOUS | Status: AC
  Administered 2018-04-15: 50 mg via INTRAVESICAL
  Filled 2018-04-15: qty 25

## 2018-04-15 MED ORDER — PHENAZOPYRIDINE HCL 200 MG PO TABS
200.0000 mg | ORAL_TABLET | Freq: Once | ORAL | Status: AC
  Administered 2018-04-15: 200 mg via ORAL
  Filled 2018-04-15: qty 1

## 2018-04-15 MED ORDER — KETOROLAC TROMETHAMINE 30 MG/ML IJ SOLN
30.0000 mg | Freq: Once | INTRAMUSCULAR | Status: DC | PRN
  Filled 2018-04-15: qty 1

## 2018-04-15 MED ORDER — PROPOFOL 10 MG/ML IV BOLUS
INTRAVENOUS | Status: AC
  Filled 2018-04-15: qty 20

## 2018-04-15 MED ORDER — CIPROFLOXACIN IN D5W 400 MG/200ML IV SOLN
INTRAVENOUS | Status: AC
  Filled 2018-04-15: qty 200

## 2018-04-15 MED ORDER — MEPERIDINE HCL 25 MG/ML IJ SOLN
6.2500 mg | INTRAMUSCULAR | Status: DC | PRN
  Filled 2018-04-15: qty 1

## 2018-04-15 MED ORDER — OXYCODONE HCL 5 MG/5ML PO SOLN
5.0000 mg | Freq: Once | ORAL | Status: DC | PRN
  Filled 2018-04-15: qty 5

## 2018-04-15 MED ORDER — PROPOFOL 10 MG/ML IV BOLUS
INTRAVENOUS | Status: DC | PRN
  Administered 2018-04-15: 170 mg via INTRAVENOUS

## 2018-04-15 MED ORDER — CIPROFLOXACIN IN D5W 400 MG/200ML IV SOLN
400.0000 mg | Freq: Once | INTRAVENOUS | Status: AC
  Administered 2018-04-15: 400 mg via INTRAVENOUS
  Filled 2018-04-15: qty 200

## 2018-04-15 MED ORDER — ACETAMINOPHEN 325 MG PO TABS
325.0000 mg | ORAL_TABLET | ORAL | Status: DC | PRN
  Filled 2018-04-15: qty 2

## 2018-04-15 MED ORDER — SUCCINYLCHOLINE CHLORIDE 200 MG/10ML IV SOSY
PREFILLED_SYRINGE | INTRAVENOUS | Status: DC | PRN
  Administered 2018-04-15: 140 mg via INTRAVENOUS

## 2018-04-15 MED ORDER — PHENAZOPYRIDINE HCL 100 MG PO TABS
ORAL_TABLET | ORAL | Status: AC
  Filled 2018-04-15: qty 2

## 2018-04-15 MED ORDER — ONDANSETRON HCL 4 MG/2ML IJ SOLN
INTRAMUSCULAR | Status: DC | PRN
  Administered 2018-04-15: 4 mg via INTRAVENOUS

## 2018-04-15 MED ORDER — HYDROCODONE-ACETAMINOPHEN 10-325 MG PO TABS: 1.0000 | ORAL_TABLET | ORAL | 0 refills | Status: DC | PRN

## 2018-04-15 MED ORDER — OXYCODONE HCL 5 MG PO TABS
5.0000 mg | ORAL_TABLET | Freq: Once | ORAL | Status: DC | PRN
  Filled 2018-04-15: qty 1

## 2018-04-15 MED ORDER — FENTANYL CITRATE (PF) 100 MCG/2ML IJ SOLN
INTRAMUSCULAR | Status: AC
  Filled 2018-04-15: qty 2

## 2018-04-15 MED ORDER — LACTATED RINGERS IV SOLN
INTRAVENOUS | Status: DC
  Administered 2018-04-15 (×2): via INTRAVENOUS
  Filled 2018-04-15: qty 1000

## 2018-04-15 MED ORDER — ONDANSETRON HCL 4 MG/2ML IJ SOLN
4.0000 mg | Freq: Once | INTRAMUSCULAR | Status: DC | PRN
  Filled 2018-04-15: qty 2

## 2018-04-15 MED ORDER — PHENAZOPYRIDINE HCL 200 MG PO TABS: 200.0000 mg | ORAL_TABLET | Freq: Three times a day (TID) | ORAL | 0 refills | Status: DC | PRN

## 2018-04-15 MED ORDER — FENTANYL CITRATE (PF) 100 MCG/2ML IJ SOLN
INTRAMUSCULAR | Status: DC | PRN
  Administered 2018-04-15 (×2): 25 ug via INTRAVENOUS
  Administered 2018-04-15: 50 ug via INTRAVENOUS

## 2018-04-15 MED FILL — HYDROCODON-APAP 10-325: 10-325 | 2 days supply | Qty: 12 | Fill #0

## 2018-04-15 SURGICAL SUPPLY — 30 items
BAG DRAIN URO-CYSTO SKYTR STRL (DRAIN) ×2 IMPLANT
BAG URINE DRAINAGE (UROLOGICAL SUPPLIES) IMPLANT
BAG URINE LEG 19OZ MD ST LTX (BAG) IMPLANT
CATH FOLEY 2WAY SLVR  5CC 20FR (CATHETERS)
CATH FOLEY 2WAY SLVR  5CC 22FR (CATHETERS)
CATH FOLEY 2WAY SLVR  5CC 24FR (CATHETERS) ×1
CATH FOLEY 2WAY SLVR 5CC 20FR (CATHETERS) IMPLANT
CATH FOLEY 2WAY SLVR 5CC 22FR (CATHETERS) IMPLANT
CATH FOLEY 2WAY SLVR 5CC 24FR (CATHETERS) ×1 IMPLANT
CATH FOLEY 3WAY 20FR (CATHETERS) IMPLANT
CLOTH BEACON ORANGE TIMEOUT ST (SAFETY) ×2 IMPLANT
ELECT BIVAP BIPO 22/24 DONUT (ELECTROSURGICAL) ×2
ELECT REM PT RETURN 9FT ADLT (ELECTROSURGICAL) ×2
ELECTRD BIVAP BIPO 22/24 DONUT (ELECTROSURGICAL) ×1 IMPLANT
ELECTRODE REM PT RTRN 9FT ADLT (ELECTROSURGICAL) ×1 IMPLANT
EVACUATOR MICROVAS BLADDER (UROLOGICAL SUPPLIES) IMPLANT
GLOVE BIO SURGEON STRL SZ8 (GLOVE) ×2 IMPLANT
GOWN STRL REUS W/ TWL LRG LVL3 (GOWN DISPOSABLE) ×1 IMPLANT
GOWN STRL REUS W/ TWL XL LVL3 (GOWN DISPOSABLE) ×1 IMPLANT
GOWN STRL REUS W/TWL LRG LVL3 (GOWN DISPOSABLE) ×1
GOWN STRL REUS W/TWL XL LVL3 (GOWN DISPOSABLE) ×1
HOLDER FOLEY CATH W/STRAP (MISCELLANEOUS) IMPLANT
IV NS IRRIG 3000ML ARTHROMATIC (IV SOLUTION) IMPLANT
KIT TURNOVER CYSTO (KITS) ×2 IMPLANT
LOOP CUT BIPOLAR 24F LRG (ELECTROSURGICAL) IMPLANT
MANIFOLD NEPTUNE II (INSTRUMENTS) ×2 IMPLANT
PACK CYSTO (CUSTOM PROCEDURE TRAY) ×2 IMPLANT
PLUG CATH AND CAP STER (CATHETERS) IMPLANT
TUBE CONNECTING 12X1/4 (SUCTIONS) ×2 IMPLANT
WATER STERILE IRR 3000ML UROMA (IV SOLUTION) IMPLANT

## 2018-04-15 NOTE — Anesthesia Preprocedure Evaluation (Signed)
Anesthesia Evaluation  Patient identified by MRN, date of birth, ID band Patient awake    Reviewed: Allergy & Precautions, NPO status , Patient's Chart, lab work & pertinent test results  Airway Mallampati: II       Dental no notable dental hx. (+) Teeth Intact   Pulmonary Current Smoker,    Pulmonary exam normal breath sounds clear to auscultation       Cardiovascular hypertension, Pt. on medications Normal cardiovascular exam Rhythm:Regular Rate:Normal     Neuro/Psych PSYCHIATRIC DISORDERS Anxiety Depression    GI/Hepatic negative GI ROS, Neg liver ROS,   Endo/Other  diabetesMorbid obesity  Renal/GU negative Renal ROS     Musculoskeletal   Abdominal (+) + obese,   Peds  Hematology   Anesthesia Other Findings   Reproductive/Obstetrics                             Anesthesia Physical Anesthesia Plan  ASA: III  Anesthesia Plan: General   Post-op Pain Management:    Induction:   PONV Risk Score and Plan: 3 and Ondansetron and Dexamethasone  Airway Management Planned: LMA  Additional Equipment:   Intra-op Plan:   Post-operative Plan:   Informed Consent: I have reviewed the patients History and Physical, chart, labs and discussed the procedure including the risks, benefits and alternatives for the proposed anesthesia with the patient or authorized representative who has indicated his/her understanding and acceptance.   Dental advisory given  Plan Discussed with: CRNA and Surgeon  Anesthesia Plan Comments:         Anesthesia Quick Evaluation

## 2018-04-15 NOTE — Anesthesia Procedure Notes (Signed)
Procedure Name: LMA Insertion Date/Time: 04/15/2018 10:36 AM Performed by: Suan Halter, CRNA Pre-anesthesia Checklist: Patient identified, Emergency Drugs available, Suction available and Patient being monitored Patient Re-evaluated:Patient Re-evaluated prior to induction Oxygen Delivery Method: Circle system utilized Preoxygenation: Pre-oxygenation with 100% oxygen Induction Type: IV induction Ventilation: Mask ventilation without difficulty LMA: LMA inserted LMA Size: 5.0 Number of attempts: 1 Airway Equipment and Method: Bite block Placement Confirmation: positive ETCO2 Tube secured with: Tape Dental Injury: Teeth and Oropharynx as per pre-operative assessment

## 2018-04-15 NOTE — Transfer of Care (Signed)
Immediate Anesthesia Transfer of Care Note  Patient: Trevor David  Procedure(s) Performed: Procedure(s) (LRB): TRANSURETHRAL RESECTION OF BLADDER TUMOR (TURBT) (N/A)  Patient Location: PACU  Anesthesia Type: General  Level of Consciousness: awake, oriented, sedated and patient cooperative  Airway & Oxygen Therapy: Patient Spontanous Breathing and Patient connected to face mask oxygen  Post-op Assessment: Report given to PACU RN and Post -op Vital signs reviewed and stable  Post vital signs: Reviewed and stable  Complications: No apparent anesthesia complications Last Vitals:  Vitals Value Taken Time  BP 172/79 04/15/2018 11:12 AM  Temp 36.8 C 04/15/2018 11:12 AM  Pulse 84 04/15/2018 11:13 AM  Resp 14 04/15/2018 11:13 AM  SpO2 99 % 04/15/2018 11:13 AM  Vitals shown include unvalidated device data.  Last Pain:  Vitals:   04/15/18 0958  TempSrc:   PainSc: 2       Patients Stated Pain Goal: 6 (04/15/18 7588)

## 2018-04-15 NOTE — Anesthesia Procedure Notes (Signed)
Procedure Name: Intubation Date/Time: 04/15/2018 10:47 AM Performed by: Suan Halter, CRNA Pre-anesthesia Checklist: Patient identified, Emergency Drugs available, Suction available and Patient being monitored Patient Re-evaluated:Patient Re-evaluated prior to induction Oxygen Delivery Method: Circle system utilized Preoxygenation: Pre-oxygenation with 100% oxygen Induction Type: IV induction Laryngoscope Size: Glidescope and 3 Grade View: Grade I Tube type: Parker flex tip Tube size: 7.5 mm Number of attempts: 1 Airway Equipment and Method: Stylet and Oral airway Placement Confirmation: ETT inserted through vocal cords under direct vision,  positive ETCO2 and breath sounds checked- equal and bilateral Secured at: 23 cm Tube secured with: Tape Dental Injury: Teeth and Oropharynx as per pre-operative assessment

## 2018-04-15 NOTE — Discharge Instructions (Signed)
Transurethral Resection of Bladder Tumor (TURBT)   Definition:  Transurethral Resection of the Bladder Tumor is a surgical procedure used to diagnose and remove tumors within the bladder. TURBT is the most common treatment for early stage bladder cancer.  General instructions:     Your recent bladder surgery requires very little post hospital care but some definite precautions.  Despite the fact that no skin incisions were used, the area around the bladder incisions are raw and covered with scabs to promote healing and prevent bleeding. Certain precautions are needed to insure that the scabs are not disturbed over the next 2-4 weeks while the healing proceeds.  Because the raw surface inside your bladder and the irritating effects of urine you may expect frequency of urination and/or urgency (a stronger desire to urinate) and perhaps even getting up at night more often. This will usually resolve or improve slowly over the healing period. You may see some blood in your urine over the first 6 weeks. Do not be alarmed, even if the urine was clear for a while. Get off your feet and drink lots of fluids until clearing occurs. If you start to pass clots or don't improve call us.  Catheter: (If you are discharged with a catheter.)  1. Keep your catheter secured to your leg at all times with tape or the supplied strap. 2. You may experience leakage of urine around your catheter- as long as the  catheter continues to drain, this is normal.  If your catheter stops draining  go to the ER. 3. You may also have blood in your urine, even after it has been clear for  several days; you may even pass some small blood clots or other material.  This  is normal as well.  If this happens, sit down and drink plenty of water to help  make urine to flush out your bladder.  If the blood in your urine becomes worse  after doing this, contact our office or return to the ER. 4. You may use the leg bag (small bag)  during the day, but use the large bag at  night.  Diet:  You may return to your normal diet immediately. Because of the raw surface of your bladder, alcohol, spicy foods, foods high in acid and drinks with caffeine may cause irritation or frequency and should be used in moderation. To keep your urine flowing freely and avoid constipation, drink plenty of fluids during the day (8-10 glasses). Tip: Avoid cranberry juice because it is very acidic.  Activity:  Your physical activity doesn't need to be restricted. However, if you are very active, you may see some blood in the urine. We suggest that you reduce your activity under the circumstances until the bleeding has stopped.  Bowels:  It is important to keep your bowels regular during the postoperative period. Straining with bowel movements can cause bleeding. A bowel movement every other day is reasonable. Use a mild laxative if needed, such as milk of magnesia 2-3 tablespoons, or 2 Dulcolax tablets. Call if you continue to have problems. If you had been taking narcotics for pain, before, during or after your surgery, you may be constipated. Take a laxative if necessary.    Medication:  You should resume your pre-surgery medications unless told not to. In addition you may be given an antibiotic to prevent or treat infection. Antibiotics are not always necessary. All medication should be taken as prescribed until the bottles are finished unless you are having  an unusual reaction to one of the drugs.   Post Anesthesia Home Care Instructions  Activity: Get plenty of rest for the remainder of the day. A responsible individual must stay with you for 24 hours following the procedure.  For the next 24 hours, DO NOT: -Drive a car -Operate machinery -Drink alcoholic beverages -Take any medication unless instructed by your physician -Make any legal decisions or sign important papers.  Meals: Start with liquid foods such as gelatin or soup.  Progress to regular foods as tolerated. Avoid greasy, spicy, heavy foods. If nausea and/or vomiting occur, drink only clear liquids until the nausea and/or vomiting subsides. Call your physician if vomiting continues.  Special Instructions/Symptoms: Your throat may feel dry or sore from the anesthesia or the breathing tube placed in your throat during surgery. If this causes discomfort, gargle with warm salt water. The discomfort should disappear within 24 hours.  If you had a scopolamine patch placed behind your ear for the management of post- operative nausea and/or vomiting:  1. The medication in the patch is effective for 72 hours, after which it should be removed.  Wrap patch in a tissue and discard in the trash. Wash hands thoroughly with soap and water. 2. You may remove the patch earlier than 72 hours if you experience unpleasant side effects which may include dry mouth, dizziness or visual disturbances. 3. Avoid touching the patch. Wash your hands with soap and water after contact with the patch.        

## 2018-04-15 NOTE — Op Note (Signed)
PATIENT:  Trevor David  PRE-OPERATIVE DIAGNOSIS: Bladder tumor  POST-OPERATIVE DIAGNOSIS: Same  PROCEDURE:  Procedure(s): 1. TRANSURETHRAL RESECTION OF BLADDER TUMOR (TURBT) (1.5 cm.) 2. Instillation of intravesical chemotherapy (epirubicin)  SURGEON:  Surgeon(s): Claybon Jabs  ANESTHESIA:   General  EBL:  Minimal  DRAINS: Urethral catheter (20 Fr. Foley)   SPECIMEN:  Bladder tumor  DISPOSITION OF SPECIMEN:  PATHOLOGY  Indication: [name] is a 72 year old male who experienced gross hematuria and was evaluated with a CT scan that revealed no abnormality of the upper tract.  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  He is therefore brought to the operating room for transurethral resection of his bladder tumor and instillation of intravesical chemotherapy postoperatively.  Description of operation: The patient was taken to the operating room and administered general anesthesia. They were then placed on the table and moved to the dorsal lithotomy position after which the genitalia was sterilely prepped and draped. An official timeout was then performed.  The 59 French resectoscope with the 30 lens and visual obturator were then passed into the bladder under direct visualization. Urethra appeared normal with some slight elongation and lateral lobe hypertrophy.  There was 1-2+ trabeculation of the bladder wall.  The visual obturator was then removed and the Gyrus resectoscope element with 30  lens was then inserted and the bladder was fully and systematically inspected. Ureteral orifices were noted to be in the normal anatomic positions.  The tumor was noted to be papillary in configuration and located on the posterior left wall.  No other tumors were identified within the bladder.  I first began by resecting the tumor down to the detrusor muscle.  I then fulgurated the edges of  the site of resection and all bleeding points.  Inspection with the water turned off revealed no active bleeding.   Reinspection of the bladder revealed all obvious tumor had been fully resected and there was no evidence of perforation. The Microvasive evacuator was then used to irrigate the bladder and remove all of the portions of bladder tumor which were sent to pathology. I then removed the resectoscope.  A 20 French Foley catheter was then inserted in the bladder and irrigated. The irrigant returned slightly pink with no clots. The patient was awakened and taken to the recovery room.  While in the recovery room 50 mg of epirubicin and solution were instilled in the bladder through the catheter and the catheter was plugged. This will remain indwelling for approximately one hour. It will then be drained from the bladder and the catheter will be removed and the patient discharged home.  PLAN OF CARE: Discharge to home after PACU  PATIENT DISPOSITION:  PACU - hemodynamically stable.

## 2018-04-17 NOTE — Anesthesia Postprocedure Evaluation (Signed)
Anesthesia Post Note  Patient: Trevor David  Procedure(s) Performed: TRANSURETHRAL RESECTION OF BLADDER TUMOR (TURBT) (N/A )     Patient location during evaluation: PACU Anesthesia Type: General Level of consciousness: awake Pain management: pain level controlled Vital Signs Assessment: post-procedure vital signs reviewed and stable Respiratory status: spontaneous breathing Cardiovascular status: stable Postop Assessment: no apparent nausea or vomiting Anesthetic complications: no    Last Vitals:  Vitals:   04/15/18 1300 04/15/18 1355  BP: (!) 159/69 (!) 162/75  Pulse: 63 63  Resp: 14 14  Temp:  37.1 C  SpO2: 95% 97%    Last Pain:  Vitals:   04/15/18 1355  TempSrc:   PainSc: 0-No pain   Pain Goal: Patients Stated Pain Goal: 6 (04/15/18 0958)               Dredyn Gubbels JR,JOHN Mateo Flow

## 2018-04-18 ENCOUNTER — Encounter (HOSPITAL_BASED_OUTPATIENT_CLINIC_OR_DEPARTMENT_OTHER): Payer: Self-pay | Admitting: Urology

## 2018-04-26 DIAGNOSIS — Z8551 Personal history of malignant neoplasm of bladder: Secondary | ICD-10-CM | POA: Diagnosis not present

## 2018-05-24 DIAGNOSIS — H401131 Primary open-angle glaucoma, bilateral, mild stage: Secondary | ICD-10-CM | POA: Diagnosis not present

## 2018-05-24 DIAGNOSIS — H5340 Unspecified visual field defects: Secondary | ICD-10-CM | POA: Diagnosis not present

## 2018-06-29 DIAGNOSIS — F419 Anxiety disorder, unspecified: Secondary | ICD-10-CM | POA: Diagnosis not present

## 2018-07-20 DIAGNOSIS — C674 Malignant neoplasm of posterior wall of bladder: Secondary | ICD-10-CM | POA: Diagnosis not present

## 2018-07-20 DIAGNOSIS — F3131 Bipolar disorder, current episode depressed, mild: Secondary | ICD-10-CM | POA: Diagnosis not present

## 2018-07-20 DIAGNOSIS — I10 Essential (primary) hypertension: Secondary | ICD-10-CM | POA: Diagnosis not present

## 2018-07-20 DIAGNOSIS — F324 Major depressive disorder, single episode, in partial remission: Secondary | ICD-10-CM | POA: Diagnosis not present

## 2018-07-20 DIAGNOSIS — E119 Type 2 diabetes mellitus without complications: Secondary | ICD-10-CM | POA: Diagnosis not present

## 2018-07-20 DIAGNOSIS — E7800 Pure hypercholesterolemia, unspecified: Secondary | ICD-10-CM | POA: Diagnosis not present

## 2018-07-26 DIAGNOSIS — Z8551 Personal history of malignant neoplasm of bladder: Secondary | ICD-10-CM | POA: Diagnosis not present

## 2018-09-04 DIAGNOSIS — K5900 Constipation, unspecified: Secondary | ICD-10-CM | POA: Diagnosis not present

## 2018-10-10 DIAGNOSIS — Q150 Congenital glaucoma: Secondary | ICD-10-CM | POA: Diagnosis not present

## 2018-10-10 DIAGNOSIS — H401131 Primary open-angle glaucoma, bilateral, mild stage: Secondary | ICD-10-CM | POA: Diagnosis not present

## 2018-10-14 ENCOUNTER — Other Ambulatory Visit: Payer: Self-pay | Admitting: Psychiatry

## 2018-10-14 DIAGNOSIS — K5900 Constipation, unspecified: Secondary | ICD-10-CM | POA: Diagnosis not present

## 2018-10-14 DIAGNOSIS — F1011 Alcohol abuse, in remission: Secondary | ICD-10-CM | POA: Diagnosis not present

## 2018-10-26 DIAGNOSIS — Z8551 Personal history of malignant neoplasm of bladder: Secondary | ICD-10-CM | POA: Diagnosis not present

## 2018-10-26 DIAGNOSIS — N400 Enlarged prostate without lower urinary tract symptoms: Secondary | ICD-10-CM | POA: Diagnosis not present

## 2018-11-27 DIAGNOSIS — G319 Degenerative disease of nervous system, unspecified: Secondary | ICD-10-CM | POA: Diagnosis not present

## 2018-11-27 DIAGNOSIS — S0083XA Contusion of other part of head, initial encounter: Secondary | ICD-10-CM | POA: Diagnosis not present

## 2018-11-27 DIAGNOSIS — S0512XA Contusion of eyeball and orbital tissues, left eye, initial encounter: Secondary | ICD-10-CM | POA: Diagnosis not present

## 2018-11-27 DIAGNOSIS — I6782 Cerebral ischemia: Secondary | ICD-10-CM | POA: Diagnosis not present

## 2018-11-27 DIAGNOSIS — Z23 Encounter for immunization: Secondary | ICD-10-CM | POA: Diagnosis not present

## 2018-11-27 DIAGNOSIS — S0003XA Contusion of scalp, initial encounter: Secondary | ICD-10-CM | POA: Diagnosis not present

## 2018-11-27 DIAGNOSIS — G8911 Acute pain due to trauma: Secondary | ICD-10-CM | POA: Diagnosis not present

## 2018-11-27 DIAGNOSIS — S0181XA Laceration without foreign body of other part of head, initial encounter: Secondary | ICD-10-CM | POA: Diagnosis not present

## 2018-11-28 DIAGNOSIS — I6782 Cerebral ischemia: Secondary | ICD-10-CM | POA: Diagnosis not present

## 2018-11-28 DIAGNOSIS — S0512XA Contusion of eyeball and orbital tissues, left eye, initial encounter: Secondary | ICD-10-CM | POA: Diagnosis not present

## 2018-11-28 DIAGNOSIS — G319 Degenerative disease of nervous system, unspecified: Secondary | ICD-10-CM | POA: Diagnosis not present

## 2018-11-28 DIAGNOSIS — S0003XA Contusion of scalp, initial encounter: Secondary | ICD-10-CM | POA: Diagnosis not present

## 2018-12-04 ENCOUNTER — Encounter: Payer: Self-pay | Admitting: Emergency Medicine

## 2018-12-04 DIAGNOSIS — F3181 Bipolar II disorder: Secondary | ICD-10-CM | POA: Insufficient documentation

## 2018-12-06 DIAGNOSIS — S0181XD Laceration without foreign body of other part of head, subsequent encounter: Secondary | ICD-10-CM | POA: Diagnosis not present

## 2018-12-06 DIAGNOSIS — Z4802 Encounter for removal of sutures: Secondary | ICD-10-CM | POA: Diagnosis not present

## 2018-12-08 DIAGNOSIS — K620 Anal polyp: Secondary | ICD-10-CM | POA: Diagnosis not present

## 2018-12-08 DIAGNOSIS — K635 Polyp of colon: Secondary | ICD-10-CM | POA: Diagnosis not present

## 2018-12-08 DIAGNOSIS — K5730 Diverticulosis of large intestine without perforation or abscess without bleeding: Secondary | ICD-10-CM | POA: Diagnosis not present

## 2018-12-08 DIAGNOSIS — K648 Other hemorrhoids: Secondary | ICD-10-CM | POA: Diagnosis not present

## 2018-12-08 DIAGNOSIS — Z1211 Encounter for screening for malignant neoplasm of colon: Secondary | ICD-10-CM | POA: Diagnosis not present

## 2018-12-08 DIAGNOSIS — D129 Benign neoplasm of anus and anal canal: Secondary | ICD-10-CM | POA: Diagnosis not present

## 2018-12-14 ENCOUNTER — Ambulatory Visit: Payer: Self-pay | Admitting: Psychiatry

## 2019-01-09 ENCOUNTER — Ambulatory Visit: Payer: Self-pay | Admitting: Psychiatry

## 2019-01-09 ENCOUNTER — Encounter: Payer: Self-pay | Admitting: Psychiatry

## 2019-01-09 ENCOUNTER — Ambulatory Visit (INDEPENDENT_AMBULATORY_CARE_PROVIDER_SITE_OTHER): Payer: Medicare Other | Admitting: Psychiatry

## 2019-01-09 DIAGNOSIS — F17200 Nicotine dependence, unspecified, uncomplicated: Secondary | ICD-10-CM

## 2019-01-09 DIAGNOSIS — F338 Other recurrent depressive disorders: Secondary | ICD-10-CM

## 2019-01-09 DIAGNOSIS — G4733 Obstructive sleep apnea (adult) (pediatric): Secondary | ICD-10-CM

## 2019-01-09 MED ORDER — ARMODAFINIL 250 MG PO TABS: 375.0000 mg | ORAL_TABLET | ORAL | 5 refills | Status: DC

## 2019-01-09 MED ORDER — VARENICLINE TARTRATE 1 MG PO TABS: 1.0000 mg | ORAL_TABLET | Freq: Two times a day (BID) | ORAL | 3 refills | Status: DC

## 2019-01-09 NOTE — Progress Notes (Signed)
Trevor David [id number] [birth year] 73 y.o.  Subjective:   Patient ID:  Trevor David is a 73 y.o. (DOB [birth year]) male.  Chief Complaint:  Chief Complaint  Patient presents with  . Follow-up    Medication Management  . Sleeping Problem    OSA     HPI Trevor David presents to the office today for follow-up of OSA and mood.  Still doing well.  CPAP  Nasal pillows more consistent and likes better.  Needs to work on better sleep routine.  Naps daytime.  Can nap on Nuvigil.  Needs it.  Retired and getting out more.  Working on boat and more active.    Patient reports stable mood and denies depressed or irritable moods.  No seasonal depression. Patient denies any recent difficulty with anxiety.   Denies appetite disturbance.  Patient reports that energy and motivation have been good.  Patient denies any difficulty with concentration.  Patient denies any suicidal ideation.  Smokes a few less than 3 daily.  Review of Systems:  Review of Systems  Genitourinary:       Bladder tumor removed.  Musculoskeletal: Positive for arthralgias and back pain. Negative for gait problem.  Neurological: Negative for tremors and weakness.  Psychiatric/Behavioral: Positive for sleep disturbance. Negative for agitation, behavioral problems, confusion, decreased concentration, dysphoric mood, hallucinations, self-injury and suicidal ideas. The patient is not nervous/anxious and is not hyperactive.        Please refer to HPI    Medications: I have reviewed the patient's current medications.  Current Outpatient Medications  Medication Sig Dispense Refill  . Armodafinil 250 MG tablet Take 1.5 tablets (375 mg total) by mouth every morning. 45 tablet 5  . aspirin EC 81 MG tablet Take 81 mg by mouth daily.    Marland Kitchen doxazosin (CARDURA) 8 MG tablet Take 1 tablet by mouth every evening.     . Furosemide (LASIX PO) Take by mouth as needed.    . indomethacin (INDOCIN) 50 MG capsule Take by mouth.     Marland Kitchen JANUMET 50-1000 MG tablet Take 1 tablet by mouth 2 (two) times daily.    Marland Kitchen latanoprost (XALATAN) 0.005 % ophthalmic solution INSTILL 1 DROP INTO EACH EYE AT BEDTIME    . lisinopril (PRINIVIL,ZESTRIL) 5 MG tablet Take 1 tablet by mouth every morning.     . pioglitazone (ACTOS) 45 MG tablet Take 1 tablet by mouth every morning.     . rosuvastatin (CRESTOR) 5 MG tablet Take 1 tablet by mouth every morning.     . traZODone (DESYREL) 50 MG tablet as needed.     . varenicline (CHANTIX) 1 MG tablet Take 1 tablet (1 mg total) by mouth 2 (two) times daily. 60 tablet 3   No current facility-administered medications for this visit.     Medication Side Effects: None  Allergies: No Known Allergies  Past Medical History:  Diagnosis Date  . Anemia    history of  . Anxiety   . Arthritis    Thumb and fingers  . Aspiration pneumonia ([place])   . Bladder tumor   . Depression   . Diabetes mellitus without complication ([place])   . Hyperlipidemia   . Left hydrocele    Small  . Obesity   . OSA on CPAP     History reviewed. No pertinent family history.  Social History   Socioeconomic History  . Marital status: Married    Spouse name: Not on file  . Number of children: Not  on file  . Years of education: Not on file  . Highest education level: Not on file  Occupational History  . Not on file  Social Needs  . Financial resource strain: Not on file  . Food insecurity:    Worry: Not on file    Inability: Not on file  . Transportation needs:    Medical: Not on file    Non-medical: Not on file  Tobacco Use  . Smoking status: Current Every Day Smoker    Packs/day: 0.50    Years: 40.00    Pack years: 20.00    Types: Cigarettes  . Smokeless tobacco: Never Used  . Tobacco comment: 0.5ppd 4.25.18 ee quit [id number] (24 years)  Substance and Sexual Activity  . Alcohol use: Yes  . Drug use: Not Currently  . Sexual activity: Not on file  Lifestyle  . Physical activity:    Days per week: Not  on file    Minutes per session: Not on file  . Stress: Not on file  Relationships  . Social connections:    Talks on phone: Not on file    Gets together: Not on file    Attends religious service: Not on file    Active member of club or organization: Not on file    Attends meetings of clubs or organizations: Not on file    Relationship status: Not on file  . Intimate partner violence:    Fear of current or ex partner: Not on file    Emotionally abused: Not on file    Physically abused: Not on file    Forced sexual activity: Not on file  Other Topics Concern  . Not on file  Social History Narrative  . Not on file    Past Medical History, Surgical history, Social history, and Family history were reviewed and updated as appropriate.   Please see review of systems for further details on the patient's review from today.   Objective:   Physical Exam:  There were no vitals taken for this visit.  Physical Exam Constitutional:      General: He is not in acute distress.    Appearance: He is well-developed.  Musculoskeletal:        General: No deformity.  Neurological:     Mental Status: He is alert and oriented to person, place, and time.     Coordination: Coordination normal.  Psychiatric:        Attention and Perception: Attention normal. He is attentive.        Mood and Affect: Mood normal. Mood is not anxious or depressed. Affect is not labile, blunt, angry or inappropriate.        Speech: Speech normal.        Behavior: Behavior normal.        Thought Content: Thought content normal. Thought content does not include homicidal or suicidal ideation. Thought content does not include homicidal or suicidal plan.        Cognition and Memory: Cognition normal.        Judgment: Judgment normal.     Comments: Insight is good.     Lab Review:     Component Value Date/Time   NA 138 04/15/2018 1010   K 4.3 04/15/2018 1010   CL 102 04/15/2018 1010   GLUCOSE 112 (H) 04/15/2018  1010   BUN 14 04/15/2018 1010   CREATININE 0.80 04/15/2018 1010       Component Value Date/Time   HGB 13.3 04/15/2018 1010  HCT 39.0 04/15/2018 1010    No results found for: POCLITH, LITHIUM   No results found for: PHENYTOIN, PHENOBARB, VALPROATE, CBMZ   .res Assessment: Plan:    Obstructive sleep apnea - Plan: Armodafinil 250 MG tablet  Nicotine dependence, uncomplicated, unspecified nicotine product type - Plan: varenicline (CHANTIX) 1 MG tablet  Seasonal depression [facility])   Trevor David has a prior history of hypomania which he attributes to medications.  He does not want to take a mood stabilizer and has been stable off of antidepressant and mood stabilizers so far.  He has a history of seasonal depression but does not see any problem so far this year.  He is aware of the risk of mania without medication.  He is using his CPAP machine more regularly have emphasized its necessity for good brain health as well as cardiovascular help health.  He benefits from the new vigil but needs to work on his sleep hygiene further.  No evidence of substance abuse  Follow-up 6 months  Lynder Parents MD, DFAPA  Please see After Visit Summary for patient specific instructions.  Future Appointments  Date Time Provider [place]  01/23/2019  9:30 AM Baird Lyons D, MD [facility] None    No orders of the defined types were placed in this encounter.     -------------------------------

## 2019-01-18 DIAGNOSIS — N401 Enlarged prostate with lower urinary tract symptoms: Secondary | ICD-10-CM | POA: Diagnosis not present

## 2019-01-23 ENCOUNTER — Ambulatory Visit: Payer: Medicare Other | Admitting: Internal Medicine

## 2019-01-24 DIAGNOSIS — Z7984 Long term (current) use of oral hypoglycemic drugs: Secondary | ICD-10-CM | POA: Diagnosis not present

## 2019-01-24 DIAGNOSIS — E7800 Pure hypercholesterolemia, unspecified: Secondary | ICD-10-CM | POA: Diagnosis not present

## 2019-01-24 DIAGNOSIS — Z1159 Encounter for screening for other viral diseases: Secondary | ICD-10-CM | POA: Diagnosis not present

## 2019-01-24 DIAGNOSIS — E119 Type 2 diabetes mellitus without complications: Secondary | ICD-10-CM | POA: Diagnosis not present

## 2019-01-24 DIAGNOSIS — I10 Essential (primary) hypertension: Secondary | ICD-10-CM | POA: Diagnosis not present

## 2019-01-24 DIAGNOSIS — Z0000 Encounter for general adult medical examination without abnormal findings: Secondary | ICD-10-CM | POA: Diagnosis not present

## 2019-01-25 DIAGNOSIS — N401 Enlarged prostate with lower urinary tract symptoms: Secondary | ICD-10-CM | POA: Diagnosis not present

## 2019-01-25 DIAGNOSIS — N400 Enlarged prostate without lower urinary tract symptoms: Secondary | ICD-10-CM | POA: Diagnosis not present

## 2019-01-25 DIAGNOSIS — Z8551 Personal history of malignant neoplasm of bladder: Secondary | ICD-10-CM | POA: Diagnosis not present

## 2019-02-15 DIAGNOSIS — H5213 Myopia, bilateral: Secondary | ICD-10-CM | POA: Diagnosis not present

## 2019-02-15 DIAGNOSIS — E119 Type 2 diabetes mellitus without complications: Secondary | ICD-10-CM | POA: Diagnosis not present

## 2019-02-15 DIAGNOSIS — H524 Presbyopia: Secondary | ICD-10-CM | POA: Diagnosis not present

## 2019-02-15 DIAGNOSIS — H401131 Primary open-angle glaucoma, bilateral, mild stage: Secondary | ICD-10-CM | POA: Diagnosis not present

## 2019-02-15 DIAGNOSIS — H52223 Regular astigmatism, bilateral: Secondary | ICD-10-CM | POA: Diagnosis not present

## 2019-03-03 ENCOUNTER — Other Ambulatory Visit: Payer: Self-pay | Admitting: Psychiatry

## 2019-03-06 ENCOUNTER — Other Ambulatory Visit: Payer: Self-pay | Admitting: Psychiatry

## 2019-07-10 ENCOUNTER — Encounter: Payer: Self-pay | Admitting: Psychiatry

## 2019-07-10 ENCOUNTER — Other Ambulatory Visit: Payer: Self-pay

## 2019-07-10 ENCOUNTER — Encounter (INDEPENDENT_AMBULATORY_CARE_PROVIDER_SITE_OTHER): Payer: Self-pay

## 2019-07-10 ENCOUNTER — Ambulatory Visit (INDEPENDENT_AMBULATORY_CARE_PROVIDER_SITE_OTHER): Payer: Medicare Other | Admitting: Psychiatry

## 2019-07-10 DIAGNOSIS — F338 Other recurrent depressive disorders: Secondary | ICD-10-CM | POA: Diagnosis not present

## 2019-07-10 DIAGNOSIS — G4733 Obstructive sleep apnea (adult) (pediatric): Secondary | ICD-10-CM | POA: Diagnosis not present

## 2019-07-10 DIAGNOSIS — F329 Major depressive disorder, single episode, unspecified: Secondary | ICD-10-CM

## 2019-07-10 DIAGNOSIS — F32A Depression, unspecified: Secondary | ICD-10-CM

## 2019-07-10 MED ORDER — CITALOPRAM HYDROBROMIDE 10 MG PO TABS: 10.0000 mg | ORAL_TABLET | Freq: Every day | ORAL | 1 refills | Status: DC

## 2019-07-10 NOTE — Progress Notes (Signed)
Trevor David [id number] August 15, 1946 73 y.o.  Subjective:   Patient ID:  Trevor David is a 72 y.o. (DOB Nov 27, 1946) male.  Chief Complaint:  Chief Complaint  Patient presents with  . Follow-up    Medication Management  . Other    Bipolar    HPI Trevor David presents to the office today for follow-up of OSA and mood.  Last seen January 09, 2019.  No meds were changed.  He was encouraged to use his CPAP more consistently for his mental health as well as other health reasons.  Overall OK.  A little down for the last few mos.  A lot of ortho pain that limits activity and motivation.  Some depression but not to the point of paralysis.  Mood is down and more weepy than usual.  Off citalopram to reduce manic risk for months  Still doing well.  CPAP  Nasal pillows more consistent and likes better.  Needs to work on better sleep routine.  Naps daytime.  Can nap on Nuvigil.  Needs it.  Retired and getting out more.  Working on boat and more active.    Patient reports stable mood and denies  irritable moods.   Patient denies any recent difficulty with anxiety.   Denies appetite disturbance.  Patient reports that energy and motivation have been good.  Patient denies any difficulty with concentration.  Patient denies any suicidal ideation.  Smokes a few less than 3 daily.  Past Psychiatric Medication Trials:\Stimulants, Strattera, Nuvigil, Wellbutrin, Lunesta, Effexor, citalopram   Review of Systems:  Review of Systems  Genitourinary:       Bladder tumor removed.  Musculoskeletal: Positive for arthralgias and back pain. Negative for gait problem.  Neurological: Negative for tremors and weakness.  Psychiatric/Behavioral: Positive for sleep disturbance. Negative for agitation, behavioral problems, confusion, decreased concentration, dysphoric mood, hallucinations, self-injury and suicidal ideas. The patient is not nervous/anxious and is not hyperactive.        Please refer to  HPI    Medications: I have reviewed the patient's current medications.  Current Outpatient Medications  Medication Sig Dispense Refill  . Armodafinil 250 MG tablet Take 1.5 tablets (375 mg total) by mouth every morning. 45 tablet 5  . aspirin EC 81 MG tablet Take 81 mg by mouth daily.    Marland Kitchen doxazosin (CARDURA) 8 MG tablet Take 1 tablet by mouth every evening.     . Furosemide (LASIX PO) Take by mouth as needed.    . indomethacin (INDOCIN) 50 MG capsule Take by mouth.    Marland Kitchen JANUMET 50-1000 MG tablet Take 1 tablet by mouth 2 (two) times daily.    Marland Kitchen latanoprost (XALATAN) 0.005 % ophthalmic solution INSTILL 1 DROP INTO EACH EYE AT BEDTIME    . lisinopril (PRINIVIL,ZESTRIL) 5 MG tablet Take 1 tablet by mouth every morning.     . pioglitazone (ACTOS) 45 MG tablet Take 1 tablet by mouth every morning.     . rosuvastatin (CRESTOR) 5 MG tablet Take 1 tablet by mouth every morning.     . citalopram (CELEXA) 10 MG tablet Take 1 tablet (10 mg total) by mouth daily. 90 tablet 1   No current facility-administered medications for this visit.     Medication Side Effects: None  Allergies: No Known Allergies  Past Medical History:  Diagnosis Date  . Anemia    history of  . Anxiety   . Arthritis    Thumb and fingers  . Aspiration pneumonia ([place])   .  Bladder tumor   . Depression   . Diabetes mellitus without complication ([place])   . Hyperlipidemia   . Left hydrocele    Small  . Obesity   . OSA on CPAP     History reviewed. No pertinent family history.  Social History   Socioeconomic History  . Marital status: Married    Spouse name: Not on file  . Number of children: Not on file  . Years of education: Not on file  . Highest education level: Not on file  Occupational History  . Not on file  Social Needs  . Financial resource strain: Not on file  . Food insecurity    Worry: Not on file    Inability: Not on file  . Transportation needs    Medical: Not on file    Non-medical: Not  on file  Tobacco Use  . Smoking status: Current Every Day Smoker    Packs/day: 0.50    Years: 40.00    Pack years: 20.00    Types: Cigarettes  . Smokeless tobacco: Never Used  . Tobacco comment: 0.5ppd 4.25.18 ee quit [id number] (24 years)  Substance and Sexual Activity  . Alcohol use: Yes  . Drug use: Not Currently  . Sexual activity: Not on file  Lifestyle  . Physical activity    Days per week: Not on file    Minutes per session: Not on file  . Stress: Not on file  Relationships  . Social Herbalist on phone: Not on file    Gets together: Not on file    Attends religious service: Not on file    Active member of club or organization: Not on file    Attends meetings of clubs or organizations: Not on file    Relationship status: Not on file  . Intimate partner violence    Fear of current or ex partner: Not on file    Emotionally abused: Not on file    Physically abused: Not on file    Forced sexual activity: Not on file  Other Topics Concern  . Not on file  Social History Narrative  . Not on file    Past Medical History, Surgical history, Social history, and Family history were reviewed and updated as appropriate.   Please see review of systems for further details on the patient's review from today.   Objective:   Physical Exam:  There were no vitals taken for this visit.  Physical Exam Constitutional:      General: He is not in acute distress.    Appearance: He is well-developed.  Musculoskeletal:        General: No deformity.  Neurological:     Mental Status: He is alert and oriented to person, place, and time.     Coordination: Coordination normal.  Psychiatric:        Attention and Perception: Attention and perception normal. He is attentive. He does not perceive auditory or visual hallucinations.        Mood and Affect: Mood is depressed. Mood is not anxious. Affect is not labile, blunt, angry or inappropriate.        Speech: Speech normal.         Behavior: Behavior normal.        Thought Content: Thought content normal. Thought content does not include homicidal or suicidal ideation. Thought content does not include homicidal or suicidal plan.        Cognition and Memory: Cognition and memory normal.  Judgment: Judgment normal.     Comments: Insight is good.     Lab Review:     Component Value Date/Time   NA 138 04/15/2018 1010   K 4.3 04/15/2018 1010   CL 102 04/15/2018 1010   GLUCOSE 112 (H) 04/15/2018 1010   BUN 14 04/15/2018 1010   CREATININE 0.80 04/15/2018 1010       Component Value Date/Time   HGB 13.3 04/15/2018 1010   HCT 39.0 04/15/2018 1010    No results found for: POCLITH, LITHIUM   No results found for: PHENYTOIN, PHENOBARB, VALPROATE, CBMZ   .res Assessment: Plan:   Trevor David was seen today for follow-up and other.  Diagnoses and all orders for this visit:  Mood disorder of depressed type -     citalopram (CELEXA) 10 MG tablet; Take 1 tablet (10 mg total) by mouth daily.  Seasonal depression ([place])  Obstructive sleep apnea  Trevor David has a prior history of hypomania which he attributes to medications.  He does not want to take a mood stabilizer and has been stable off of antidepressant and mood stabilizers until recently.  He wants to try a lower dose citalopram despite risk of hypomania.  Still refuses mood stabilizer.   Disc manic sx.Marland Kitchen  He is aware of the risk of mania without medication.  Try to leg by with lowest dose possible.  Citalopram 10 mg daily.  He is using his CPAP machine more regularly have emphasized its necessity for good brain health as well as cardiovascular help health.  He benefits from the NUvigil but needs to work on his sleep hygiene further.  No evidence of substance abuse  Follow-up December.  November is when usual seasonal depression flares  Lynder Parents MD, DFAPA  Please see After Visit Summary for patient specific instructions.  No future appointments.  No  orders of the defined types were placed in this encounter.     -------------------------------

## 2019-07-11 DIAGNOSIS — H5213 Myopia, bilateral: Secondary | ICD-10-CM | POA: Diagnosis not present

## 2019-07-11 DIAGNOSIS — H5340 Unspecified visual field defects: Secondary | ICD-10-CM | POA: Diagnosis not present

## 2019-07-11 DIAGNOSIS — H524 Presbyopia: Secondary | ICD-10-CM | POA: Diagnosis not present

## 2019-07-11 DIAGNOSIS — H401131 Primary open-angle glaucoma, bilateral, mild stage: Secondary | ICD-10-CM | POA: Diagnosis not present

## 2019-07-11 DIAGNOSIS — H52223 Regular astigmatism, bilateral: Secondary | ICD-10-CM | POA: Diagnosis not present

## 2019-07-18 DIAGNOSIS — N400 Enlarged prostate without lower urinary tract symptoms: Secondary | ICD-10-CM | POA: Diagnosis not present

## 2019-07-18 DIAGNOSIS — Z8551 Personal history of malignant neoplasm of bladder: Secondary | ICD-10-CM | POA: Diagnosis not present

## 2019-07-25 DIAGNOSIS — F3131 Bipolar disorder, current episode depressed, mild: Secondary | ICD-10-CM | POA: Diagnosis not present

## 2019-07-25 DIAGNOSIS — M79671 Pain in right foot: Secondary | ICD-10-CM | POA: Diagnosis not present

## 2019-07-25 DIAGNOSIS — I10 Essential (primary) hypertension: Secondary | ICD-10-CM | POA: Diagnosis not present

## 2019-07-25 DIAGNOSIS — F324 Major depressive disorder, single episode, in partial remission: Secondary | ICD-10-CM | POA: Diagnosis not present

## 2019-07-25 DIAGNOSIS — F17200 Nicotine dependence, unspecified, uncomplicated: Secondary | ICD-10-CM | POA: Diagnosis not present

## 2019-07-25 DIAGNOSIS — E7800 Pure hypercholesterolemia, unspecified: Secondary | ICD-10-CM | POA: Diagnosis not present

## 2019-07-25 DIAGNOSIS — Z8551 Personal history of malignant neoplasm of bladder: Secondary | ICD-10-CM | POA: Diagnosis not present

## 2019-07-25 DIAGNOSIS — E119 Type 2 diabetes mellitus without complications: Secondary | ICD-10-CM | POA: Diagnosis not present

## 2019-07-25 DIAGNOSIS — Z7984 Long term (current) use of oral hypoglycemic drugs: Secondary | ICD-10-CM | POA: Diagnosis not present

## 2019-08-09 DIAGNOSIS — M7671 Peroneal tendinitis, right leg: Secondary | ICD-10-CM | POA: Diagnosis not present

## 2019-08-09 DIAGNOSIS — M67371 Transient synovitis, right ankle and foot: Secondary | ICD-10-CM | POA: Diagnosis not present

## 2019-08-09 DIAGNOSIS — M19071 Primary osteoarthritis, right ankle and foot: Secondary | ICD-10-CM | POA: Diagnosis not present

## 2019-08-15 DIAGNOSIS — M19071 Primary osteoarthritis, right ankle and foot: Secondary | ICD-10-CM | POA: Diagnosis not present

## 2019-08-15 DIAGNOSIS — M84363A Stress fracture, right fibula, initial encounter for fracture: Secondary | ICD-10-CM | POA: Diagnosis not present

## 2019-08-15 DIAGNOSIS — M67372 Transient synovitis, left ankle and foot: Secondary | ICD-10-CM | POA: Diagnosis not present

## 2019-08-15 DIAGNOSIS — M7671 Peroneal tendinitis, right leg: Secondary | ICD-10-CM | POA: Diagnosis not present

## 2019-08-15 DIAGNOSIS — M67371 Transient synovitis, right ankle and foot: Secondary | ICD-10-CM | POA: Diagnosis not present

## 2019-08-15 DIAGNOSIS — S93491A Sprain of other ligament of right ankle, initial encounter: Secondary | ICD-10-CM | POA: Diagnosis not present

## 2019-08-25 DIAGNOSIS — M7671 Peroneal tendinitis, right leg: Secondary | ICD-10-CM | POA: Diagnosis not present

## 2019-08-25 DIAGNOSIS — M84363D Stress fracture, right fibula, subsequent encounter for fracture with routine healing: Secondary | ICD-10-CM | POA: Diagnosis not present

## 2019-09-01 ENCOUNTER — Other Ambulatory Visit: Payer: Self-pay | Admitting: Psychiatry

## 2019-09-01 DIAGNOSIS — G4733 Obstructive sleep apnea (adult) (pediatric): Secondary | ICD-10-CM

## 2019-09-02 DIAGNOSIS — M25561 Pain in right knee: Secondary | ICD-10-CM | POA: Diagnosis not present

## 2019-09-06 DIAGNOSIS — M65331 Trigger finger, right middle finger: Secondary | ICD-10-CM | POA: Diagnosis not present

## 2019-09-06 DIAGNOSIS — M6208 Separation of muscle (nontraumatic), other site: Secondary | ICD-10-CM | POA: Diagnosis not present

## 2019-09-06 DIAGNOSIS — M720 Palmar fascial fibromatosis [Dupuytren]: Secondary | ICD-10-CM | POA: Diagnosis not present

## 2019-09-06 DIAGNOSIS — K429 Umbilical hernia without obstruction or gangrene: Secondary | ICD-10-CM | POA: Diagnosis not present

## 2019-09-15 ENCOUNTER — Telehealth: Payer: Self-pay

## 2019-09-15 NOTE — Telephone Encounter (Signed)
Prior authorization submitted and approved for Armodafinil 250 mg effective 09/07/2019-09/06/2020 through [facility]

## 2019-10-02 DIAGNOSIS — M25561 Pain in right knee: Secondary | ICD-10-CM | POA: Diagnosis not present

## 2019-11-13 ENCOUNTER — Encounter: Payer: Self-pay | Admitting: Psychiatry

## 2019-11-13 ENCOUNTER — Other Ambulatory Visit: Payer: Self-pay

## 2019-11-13 ENCOUNTER — Ambulatory Visit (INDEPENDENT_AMBULATORY_CARE_PROVIDER_SITE_OTHER): Payer: Medicare Other | Admitting: Psychiatry

## 2019-11-13 DIAGNOSIS — H5213 Myopia, bilateral: Secondary | ICD-10-CM | POA: Diagnosis not present

## 2019-11-13 DIAGNOSIS — F338 Other recurrent depressive disorders: Secondary | ICD-10-CM | POA: Diagnosis not present

## 2019-11-13 DIAGNOSIS — F17200 Nicotine dependence, unspecified, uncomplicated: Secondary | ICD-10-CM | POA: Diagnosis not present

## 2019-11-13 DIAGNOSIS — F329 Major depressive disorder, single episode, unspecified: Secondary | ICD-10-CM

## 2019-11-13 DIAGNOSIS — G4733 Obstructive sleep apnea (adult) (pediatric): Secondary | ICD-10-CM | POA: Diagnosis not present

## 2019-11-13 DIAGNOSIS — H401131 Primary open-angle glaucoma, bilateral, mild stage: Secondary | ICD-10-CM | POA: Diagnosis not present

## 2019-11-13 DIAGNOSIS — F32A Depression, unspecified: Secondary | ICD-10-CM

## 2019-11-13 DIAGNOSIS — H524 Presbyopia: Secondary | ICD-10-CM | POA: Diagnosis not present

## 2019-11-13 DIAGNOSIS — H52223 Regular astigmatism, bilateral: Secondary | ICD-10-CM | POA: Diagnosis not present

## 2019-11-13 MED ORDER — CITALOPRAM HYDROBROMIDE 10 MG PO TABS: 10.0000 mg | ORAL_TABLET | Freq: Every day | ORAL | 1 refills | Status: DC

## 2019-11-13 MED ORDER — ARMODAFINIL 250 MG PO TABS: 375.0000 mg | ORAL_TABLET | Freq: Every day | ORAL | 5 refills | Status: DC

## 2019-11-13 MED ORDER — VARENICLINE TARTRATE 1 MG PO TABS: 1.0000 mg | ORAL_TABLET | Freq: Two times a day (BID) | ORAL | 3 refills | Status: DC

## 2019-11-13 NOTE — Progress Notes (Signed)
Draden Rumley Barnfield [id number] Jul 18, 1946 73 y.o.  Subjective:   Patient ID:  Trevor David is a 73 y.o. (DOB Jan 19, 1946) male.  Chief Complaint:  Chief Complaint  Patient presents with  . Follow-up    mood change    HPI Trevor David presents to the office today for follow-up of OSA and mood.  When seen January 09, 2019.  No meds were changed.  He was encouraged to use his CPAP more consistently for his mental health as well as other health reasons.  Last seen August 2020.  He wanted to restart low-dose citalopram which was restarted at 10 mg daily.  More problems with back and knees.  [name] broke hip Oct 11 and caring for her.  He's having to do everything.  We've aged 10 years in 74m os.  W just got to the point of going to [facility] together.  Close to wife.  Down on energy but not depressed and not manic and no weeping. Added citalopram was helpeful.  No SE.  Still doing well.  CPAP  Nasal pillows less consistent bc sleeping temporarily in recliner DT wife.  Plans to get back on CPAP.  Reports stable mood and denies  irritable moods.   Patient denies any recent difficulty with anxiety.   Denies appetite disturbance.  Patient reports that energy and motivation have been good.  Patient denies any difficulty with concentration.  Patient denies any suicidal ideation.  Smokes a bit more off Chantix.  Plans to restart it..  Plans to stop church and find another.  s  Past Psychiatric Medication Trials:\Stimulants, Strattera, Nuvigil, Wellbutrin, Lunesta, Effexor, citalopram   Review of Systems:  Review of Systems  Genitourinary:       Bladder tumor removed.  Musculoskeletal: Positive for arthralgias, back pain and gait problem.  Neurological: Negative for tremors and weakness.  Psychiatric/Behavioral: Positive for sleep disturbance. Negative for agitation, behavioral problems, confusion, decreased concentration, dysphoric mood, hallucinations, self-injury and suicidal ideas.  The patient is not nervous/anxious and is not hyperactive.        Please refer to HPI    Medications: I have reviewed the patient's current medications.  Current Outpatient Medications  Medication Sig Dispense Refill  . Armodafinil 250 MG tablet TAKE 1 & 1/2 (ONE & ONE-HALF) TABLETS BY MOUTH ONCE DAILY IN THE MORNING 45 tablet 5  . aspirin EC 81 MG tablet Take 81 mg by mouth daily.    . citalopram (CELEXA) 10 MG tablet Take 1 tablet (10 mg total) by mouth daily. 90 tablet 1  . doxazosin (CARDURA) 8 MG tablet Take 1 tablet by mouth every evening.     . Furosemide (LASIX PO) Take by mouth as needed.    . indomethacin (INDOCIN) 50 MG capsule Take by mouth.    Marland Kitchen JANUMET 50-1000 MG tablet Take 1 tablet by mouth 2 (two) times daily.    Marland Kitchen latanoprost (XALATAN) 0.005 % ophthalmic solution INSTILL 1 DROP INTO EACH EYE AT BEDTIME    . lisinopril (PRINIVIL,ZESTRIL) 5 MG tablet Take 1 tablet by mouth every morning.     . pioglitazone (ACTOS) 45 MG tablet Take 1 tablet by mouth every morning.     . rosuvastatin (CRESTOR) 5 MG tablet Take 1 tablet by mouth every morning.      No current facility-administered medications for this visit.     Medication Side Effects: None  Allergies: No Known Allergies  Past Medical History:  Diagnosis Date  . Anemia  history of  . Anxiety   . Arthritis    Thumb and fingers  . Aspiration pneumonia ([place])   . Bladder tumor   . Depression   . Diabetes mellitus without complication ([place])   . Hyperlipidemia   . Left hydrocele    Small  . Obesity   . OSA on CPAP     History reviewed. No pertinent family history.  Social History   Socioeconomic History  . Marital status: Married    Spouse name: Not on file  . Number of children: Not on file  . Years of education: Not on file  . Highest education level: Not on file  Occupational History  . Not on file  Social Needs  . Financial resource strain: Not on file  . Food insecurity    Worry: Not on file     Inability: Not on file  . Transportation needs    Medical: Not on file    Non-medical: Not on file  Tobacco Use  . Smoking status: Current Every Day Smoker    Packs/day: 0.50    Years: 40.00    Pack years: 20.00    Types: Cigarettes  . Smokeless tobacco: Never Used  . Tobacco comment: 0.5ppd 4.25.18 ee quit [id number] (24 years)  Substance and Sexual Activity  . Alcohol use: Yes  . Drug use: Not Currently  . Sexual activity: Not on file  Lifestyle  . Physical activity    Days per week: Not on file    Minutes per session: Not on file  . Stress: Not on file  Relationships  . Social Herbalist on phone: Not on file    Gets together: Not on file    Attends religious service: Not on file    Active member of club or organization: Not on file    Attends meetings of clubs or organizations: Not on file    Relationship status: Not on file  . Intimate partner violence    Fear of current or ex partner: Not on file    Emotionally abused: Not on file    Physically abused: Not on file    Forced sexual activity: Not on file  Other Topics Concern  . Not on file  Social History Narrative  . Not on file    Past Medical History, Surgical history, Social history, and Family history were reviewed and updated as appropriate.   Please see review of systems for further details on the patient's review from today.   Objective:   Physical Exam:  There were no vitals taken for this visit.  Physical Exam Constitutional:      General: He is not in acute distress.    Appearance: He is well-developed.  Musculoskeletal:        General: No deformity.  Neurological:     Mental Status: He is alert and oriented to person, place, and time.     Coordination: Coordination normal.  Psychiatric:        Attention and Perception: Attention and perception normal. He is attentive. He does not perceive auditory or visual hallucinations.        Mood and Affect: Mood is depressed. Mood is not  anxious. Affect is not labile, blunt, angry or inappropriate.        Speech: Speech normal.        Behavior: Behavior normal.        Thought Content: Thought content normal. Thought content does not include homicidal or suicidal ideation.  Thought content does not include homicidal or suicidal plan.        Cognition and Memory: Cognition and memory normal.        Judgment: Judgment normal.     Comments: Insight is good.     Lab Review:     Component Value Date/Time   NA 138 04/15/2018 1010   K 4.3 04/15/2018 1010   CL 102 04/15/2018 1010   GLUCOSE 112 (H) 04/15/2018 1010   BUN 14 04/15/2018 1010   CREATININE 0.80 04/15/2018 1010       Component Value Date/Time   HGB 13.3 04/15/2018 1010   HCT 39.0 04/15/2018 1010    No results found for: POCLITH, LITHIUM   No results found for: PHENYTOIN, PHENOBARB, VALPROATE, CBMZ   .res Assessment: Plan:   Trevor David was seen today for follow-up.  Diagnoses and all orders for this visit:  Mood disorder of depressed type  Seasonal depression ([place])  Obstructive sleep apnea  Nicotine dependence, uncomplicated, unspecified nicotine product type  Trevor David has a prior history of hypomania which he attributes to medications.  He does not want to take a mood stabilizer and has been stable off of antidepressant and mood stabilizers until recently.  He wants to try a lower dose citalopram despite risk of hypomania.  Still refuses mood stabilizer.   Disc manic sx.Marland Kitchen  He is aware of the risk of mania without medication.  Try to get by with lowest dose possible.  Citalopram 10 mg daily.  He is using his CPAP machine more regularly have emphasized its necessity for good brain health as well as cardiovascular help health.  He benefits from the NUvigil but needs to work on his sleep hygiene further.  No evidence of substance abuse. Restarted Chantix 10 days ago and plans to quit and plans to quit alcohol and not having a problem with it.  Follow-up  6  mos  Lynder Parents MD, DFAPA  Please see After Visit Summary for patient specific instructions.  No future appointments.  No orders of the defined types were placed in this encounter.     -------------------------------

## 2019-12-26 DIAGNOSIS — M19042 Primary osteoarthritis, left hand: Secondary | ICD-10-CM | POA: Diagnosis not present

## 2019-12-26 DIAGNOSIS — M19041 Primary osteoarthritis, right hand: Secondary | ICD-10-CM | POA: Diagnosis not present

## 2019-12-26 DIAGNOSIS — M170 Bilateral primary osteoarthritis of knee: Secondary | ICD-10-CM | POA: Diagnosis not present

## 2020-01-02 DIAGNOSIS — M170 Bilateral primary osteoarthritis of knee: Secondary | ICD-10-CM | POA: Diagnosis not present

## 2020-01-09 DIAGNOSIS — M170 Bilateral primary osteoarthritis of knee: Secondary | ICD-10-CM | POA: Diagnosis not present

## 2020-01-15 DIAGNOSIS — G5603 Carpal tunnel syndrome, bilateral upper limbs: Secondary | ICD-10-CM | POA: Diagnosis not present

## 2020-01-17 DIAGNOSIS — N3941 Urge incontinence: Secondary | ICD-10-CM | POA: Diagnosis not present

## 2020-01-17 DIAGNOSIS — Z8551 Personal history of malignant neoplasm of bladder: Secondary | ICD-10-CM | POA: Diagnosis not present

## 2020-02-01 DIAGNOSIS — G5603 Carpal tunnel syndrome, bilateral upper limbs: Secondary | ICD-10-CM | POA: Diagnosis not present

## 2020-02-01 DIAGNOSIS — G4733 Obstructive sleep apnea (adult) (pediatric): Secondary | ICD-10-CM | POA: Diagnosis not present

## 2020-02-01 DIAGNOSIS — I10 Essential (primary) hypertension: Secondary | ICD-10-CM | POA: Diagnosis not present

## 2020-02-01 DIAGNOSIS — Z8551 Personal history of malignant neoplasm of bladder: Secondary | ICD-10-CM | POA: Diagnosis not present

## 2020-02-01 DIAGNOSIS — F17200 Nicotine dependence, unspecified, uncomplicated: Secondary | ICD-10-CM | POA: Diagnosis not present

## 2020-02-01 DIAGNOSIS — E7800 Pure hypercholesterolemia, unspecified: Secondary | ICD-10-CM | POA: Diagnosis not present

## 2020-02-01 DIAGNOSIS — E119 Type 2 diabetes mellitus without complications: Secondary | ICD-10-CM | POA: Diagnosis not present

## 2020-02-01 DIAGNOSIS — M109 Gout, unspecified: Secondary | ICD-10-CM | POA: Diagnosis not present

## 2020-02-01 DIAGNOSIS — F324 Major depressive disorder, single episode, in partial remission: Secondary | ICD-10-CM | POA: Diagnosis not present

## 2020-02-01 DIAGNOSIS — M720 Palmar fascial fibromatosis [Dupuytren]: Secondary | ICD-10-CM | POA: Diagnosis not present

## 2020-02-01 DIAGNOSIS — Z0000 Encounter for general adult medical examination without abnormal findings: Secondary | ICD-10-CM | POA: Diagnosis not present

## 2020-02-12 DIAGNOSIS — E669 Obesity, unspecified: Secondary | ICD-10-CM | POA: Diagnosis not present

## 2020-02-12 DIAGNOSIS — E119 Type 2 diabetes mellitus without complications: Secondary | ICD-10-CM | POA: Diagnosis not present

## 2020-02-12 DIAGNOSIS — I10 Essential (primary) hypertension: Secondary | ICD-10-CM | POA: Diagnosis not present

## 2020-02-12 DIAGNOSIS — M109 Gout, unspecified: Secondary | ICD-10-CM | POA: Diagnosis not present

## 2020-02-12 DIAGNOSIS — G4733 Obstructive sleep apnea (adult) (pediatric): Secondary | ICD-10-CM | POA: Diagnosis not present

## 2020-02-23 DIAGNOSIS — G4733 Obstructive sleep apnea (adult) (pediatric): Secondary | ICD-10-CM | POA: Diagnosis not present

## 2020-04-29 DIAGNOSIS — M109 Gout, unspecified: Secondary | ICD-10-CM | POA: Diagnosis not present

## 2020-04-29 DIAGNOSIS — G4733 Obstructive sleep apnea (adult) (pediatric): Secondary | ICD-10-CM | POA: Diagnosis not present

## 2020-04-29 DIAGNOSIS — F17200 Nicotine dependence, unspecified, uncomplicated: Secondary | ICD-10-CM | POA: Diagnosis not present

## 2020-04-29 DIAGNOSIS — Z8551 Personal history of malignant neoplasm of bladder: Secondary | ICD-10-CM | POA: Diagnosis not present

## 2020-04-29 DIAGNOSIS — M720 Palmar fascial fibromatosis [Dupuytren]: Secondary | ICD-10-CM | POA: Diagnosis not present

## 2020-04-29 DIAGNOSIS — E119 Type 2 diabetes mellitus without complications: Secondary | ICD-10-CM | POA: Diagnosis not present

## 2020-04-29 DIAGNOSIS — F3131 Bipolar disorder, current episode depressed, mild: Secondary | ICD-10-CM | POA: Diagnosis not present

## 2020-04-29 DIAGNOSIS — I10 Essential (primary) hypertension: Secondary | ICD-10-CM | POA: Diagnosis not present

## 2020-04-29 DIAGNOSIS — R350 Frequency of micturition: Secondary | ICD-10-CM | POA: Diagnosis not present

## 2020-04-29 DIAGNOSIS — E7800 Pure hypercholesterolemia, unspecified: Secondary | ICD-10-CM | POA: Diagnosis not present

## 2020-05-08 DIAGNOSIS — G4733 Obstructive sleep apnea (adult) (pediatric): Secondary | ICD-10-CM | POA: Diagnosis not present

## 2020-05-14 ENCOUNTER — Encounter: Payer: Self-pay | Admitting: Psychiatry

## 2020-05-14 ENCOUNTER — Other Ambulatory Visit: Payer: Self-pay

## 2020-05-14 ENCOUNTER — Ambulatory Visit (INDEPENDENT_AMBULATORY_CARE_PROVIDER_SITE_OTHER): Payer: Medicare Other | Admitting: Psychiatry

## 2020-05-14 DIAGNOSIS — F17200 Nicotine dependence, unspecified, uncomplicated: Secondary | ICD-10-CM | POA: Diagnosis not present

## 2020-05-14 DIAGNOSIS — F338 Other recurrent depressive disorders: Secondary | ICD-10-CM | POA: Diagnosis not present

## 2020-05-14 DIAGNOSIS — F32A Depression, unspecified: Secondary | ICD-10-CM

## 2020-05-14 DIAGNOSIS — F5105 Insomnia due to other mental disorder: Secondary | ICD-10-CM | POA: Diagnosis not present

## 2020-05-14 DIAGNOSIS — F329 Major depressive disorder, single episode, unspecified: Secondary | ICD-10-CM | POA: Diagnosis not present

## 2020-05-14 MED ORDER — ZOLPIDEM TARTRATE 10 MG PO TABS: 10.0000 mg | ORAL_TABLET | Freq: Every evening | ORAL | 0 refills | Status: DC | PRN

## 2020-05-14 NOTE — Progress Notes (Signed)
Manjinder Breau Allard [id number] [birth year] 74 y.o.  Subjective:   Patient ID:  Trevor David is a 74 y.o. (DOB 1946-07-14) male.  Chief Complaint:  Chief Complaint  Patient presents with  . Follow-up    mood and sleep  . Sleeping Problem    HPI Trevor David presents to the office today for follow-up of OSA and mood.  When seen January 09, 2019.  No meds were changed.  He was encouraged to use his CPAP more consistently for his mental health as well as other health reasons.  seen August 2020.  He wanted to restart low-dose citalopram which was restarted at 10 mg daily.   Seen 11/2019 with following noted; More problems with back and knees.  [name] broke hip Oct 11 and caring for her.  He's having to do everything.  We've aged 10 years in 23m os.  W just got to the point of going to [facility] together.  Close to wife. Down on energy but not depressed and not manic and no weeping. Added citalopram was helpeful.  No SE. Still doing well.  CPAP  Nasal pillows less consistent bc sleeping temporarily in recliner DT wife.  Plans to get back on CPAP. Plan no med changes.  05/14/20 appt with following noted: Very well. Back on CPAP but hard to keep it up.  Nose pain but fixed it. AHI dropped from 86 to 4 with CPAP. Sometimes mind races and hard to sleep.  Trouble falling asleep for awhile.  Noticed some signs last week of mild mania without euphoria but with racing thoughts and cloudy thinking.  No concerns from family except irritable for a couple of weeks before . Energy was not high with hypomania. No further weepy spells.  Citalopram helped. Want's sleeper.      Patient denies any recent difficulty with anxiety.   Denies appetite disturbance.  Patient reports that energy and motivation have been good.  Patient denies any difficulty with concentration.  Patient denies any suicidal ideation.  Smokes a bit more off Chantix.  Plans to restart it..  Past Psychiatric Medication Trials:  Stimulants, Strattera, Nuvigil, Wellbutrin,  Effexor, citalopram Trazodone NR, Lunesta,  Review of Systems:  Review of Systems  Genitourinary:       Bladder tumor removed.  Musculoskeletal: Positive for arthralgias and back pain. Negative for gait problem.  Neurological: Negative for tremors and weakness.  Psychiatric/Behavioral: Positive for sleep disturbance. Negative for agitation, behavioral problems, confusion, decreased concentration, dysphoric mood, hallucinations, self-injury and suicidal ideas. The patient is not nervous/anxious and is not hyperactive.        Please refer to HPI    Medications: I have reviewed the patient's current medications.  Current Outpatient Medications  Medication Sig Dispense Refill  . Armodafinil 250 MG tablet Take 1.5 tablets (375 mg total) by mouth daily. 45 tablet 5  . aspirin EC 81 MG tablet Take 81 mg by mouth daily.    . citalopram (CELEXA) 10 MG tablet Take 1 tablet (10 mg total) by mouth daily. 90 tablet 1  . doxazosin (CARDURA) 8 MG tablet Take 1 tablet by mouth every evening.     . Furosemide (LASIX PO) Take by mouth as needed.    . indomethacin (INDOCIN) 50 MG capsule Take by mouth.    Marland Kitchen JANUMET 50-1000 MG tablet Take 1 tablet by mouth 2 (two) times daily.    Marland Kitchen latanoprost (XALATAN) 0.005 % ophthalmic solution INSTILL 1 DROP INTO EACH EYE AT BEDTIME    .  lisinopril (PRINIVIL,ZESTRIL) 5 MG tablet Take 1 tablet by mouth every morning.     . pioglitazone (ACTOS) 45 MG tablet Take 1 tablet by mouth every morning.     . rosuvastatin (CRESTOR) 5 MG tablet Take 1 tablet by mouth every morning.     . varenicline (CHANTIX CONTINUING MONTH PAK) 1 MG tablet Take 1 tablet (1 mg total) by mouth 2 (two) times daily. 60 tablet 3  . zolpidem (AMBIEN) 10 MG tablet Take 1 tablet (10 mg total) by mouth at bedtime as needed for sleep. 30 tablet 0   No current facility-administered medications for this visit.    Medication Side Effects: None  Allergies: No  Known Allergies  Past Medical History:  Diagnosis Date  . Anemia    history of  . Anxiety   . Arthritis    Thumb and fingers  . Aspiration pneumonia ([place])   . Bladder tumor   . Depression   . Diabetes mellitus without complication ([place])   . Hyperlipidemia   . Left hydrocele    Small  . Obesity   . OSA on CPAP     History reviewed. No pertinent family history.  Social History   Socioeconomic History  . Marital status: Married    Spouse name: Not on file  . Number of children: Not on file  . Years of education: Not on file  . Highest education level: Not on file  Occupational History  . Not on file  Tobacco Use  . Smoking status: Current Every Day Smoker    Packs/day: 0.50    Years: 40.00    Pack years: 20.00    Types: Cigarettes  . Smokeless tobacco: Never Used  . Tobacco comment: 0.5ppd 4.25.18 ee quit [id number] (24 years)  Substance and Sexual Activity  . Alcohol use: Yes  . Drug use: Not Currently  . Sexual activity: Not on file  Other Topics Concern  . Not on file  Social History Narrative  . Not on file   Social Determinants of Health   Financial Resource Strain:   . Difficulty of Paying Living Expenses:   Food Insecurity:   . Worried About Charity fundraiser in the Last Year:   . Arboriculturist in the Last Year:   Transportation Needs:   . Film/video editor (Medical):   Marland Kitchen Lack of Transportation (Non-Medical):   Physical Activity:   . Days of Exercise per Week:   . Minutes of Exercise per Session:   Stress:   . Feeling of Stress :   Social Connections:   . Frequency of Communication with Friends and Family:   . Frequency of Social Gatherings with Friends and Family:   . Attends Religious Services:   . Active Member of Clubs or Organizations:   . Attends Archivist Meetings:   Marland Kitchen Marital Status:   Intimate Partner Violence:   . Fear of Current or Ex-Partner:   . Emotionally Abused:   Marland Kitchen Physically Abused:   . Sexually  Abused:     Past Medical History, Surgical history, Social history, and Family history were reviewed and updated as appropriate.   Please see review of systems for further details on the patient's review from today.   Objective:   Physical Exam:  There were no vitals taken for this visit.  Physical Exam Constitutional:      General: He is not in acute distress.    Appearance: He is well-developed.  Musculoskeletal:  General: No deformity.  Neurological:     Mental Status: He is alert and oriented to person, place, and time.     Coordination: Coordination normal.  Psychiatric:        Attention and Perception: Attention and perception normal. He is attentive. He does not perceive auditory or visual hallucinations.        Mood and Affect: Mood is not anxious or depressed. Affect is not labile, blunt, angry or inappropriate.        Speech: Speech normal. Speech is not rapid and pressured.        Behavior: Behavior normal. Behavior is not agitated, aggressive or hyperactive.        Thought Content: Thought content normal. Thought content does not include homicidal or suicidal ideation. Thought content does not include homicidal or suicidal plan.        Cognition and Memory: Cognition and memory normal.        Judgment: Judgment normal.     Comments: Insight is good.     Lab Review:     Component Value Date/Time   NA 138 04/15/2018 1010   K 4.3 04/15/2018 1010   CL 102 04/15/2018 1010   GLUCOSE 112 (H) 04/15/2018 1010   BUN 14 04/15/2018 1010   CREATININE 0.80 04/15/2018 1010       Component Value Date/Time   HGB 13.3 04/15/2018 1010   HCT 39.0 04/15/2018 1010    No results found for: POCLITH, LITHIUM   No results found for: PHENYTOIN, PHENOBARB, VALPROATE, CBMZ   .res Assessment: Plan:   Ward was seen today for follow-up and sleeping problem.  Diagnoses and all orders for this visit:  Mood disorder of depressed type  Seasonal depression  ([place])  Nicotine dependence, uncomplicated, unspecified nicotine product type  Insomnia due to mental condition -     zolpidem (AMBIEN) 10 MG tablet; Take 1 tablet (10 mg total) by mouth at bedtime as needed for sleep.  Trevor David has a prior history of hypomania which he attributes to medications.  He does not want to take a mood stabilizer and has been stable off of antidepressant and mood stabilizers until recently.  He wanted to try a lower dose citalopram despite risk of hypomania in 2020 for weepiness and it helped.  Still refuses mood stabilizer.   Disc manic sx.Marland Kitchen  He is aware of the risk of mania without medication.  He has not had any harm from mania or severe mania.   Continue lowest dose possible for weepiness.  Citalopram 10 mg daily. He's aware of risk cycling with SSRIs.  If hypomania doesn't resolve quickly then call.   Ambien 10 mg prn.  Disc SE risks He is using his CPAP machine more regularly have emphasized its necessity for good brain health as well as cardiovascular help health.  He benefits from the NUvigil but needs to work on his sleep hygiene further.  No evidence of substance abuse. Restarted Chantix 10 days ago and plans to quit and plans to quit alcohol and not having a problem with it.  Follow-up  6 mos or sooner if mood does not remain stable  Lynder Parents MD, DFAPA  Please see After Visit Summary for patient specific instructions.  No future appointments.  No orders of the defined types were placed in this encounter.     -------------------------------

## 2020-05-17 ENCOUNTER — Other Ambulatory Visit: Payer: Self-pay | Admitting: Psychiatry

## 2020-05-17 DIAGNOSIS — G4733 Obstructive sleep apnea (adult) (pediatric): Secondary | ICD-10-CM

## 2020-07-15 DIAGNOSIS — M170 Bilateral primary osteoarthritis of knee: Secondary | ICD-10-CM | POA: Diagnosis not present

## 2020-07-18 ENCOUNTER — Other Ambulatory Visit: Payer: Self-pay | Admitting: Family Medicine

## 2020-07-18 DIAGNOSIS — N62 Hypertrophy of breast: Secondary | ICD-10-CM | POA: Diagnosis not present

## 2020-07-18 DIAGNOSIS — N644 Mastodynia: Secondary | ICD-10-CM

## 2020-07-18 DIAGNOSIS — N6320 Unspecified lump in the left breast, unspecified quadrant: Secondary | ICD-10-CM

## 2020-07-22 DIAGNOSIS — M170 Bilateral primary osteoarthritis of knee: Secondary | ICD-10-CM | POA: Diagnosis not present

## 2020-07-29 DIAGNOSIS — M170 Bilateral primary osteoarthritis of knee: Secondary | ICD-10-CM | POA: Diagnosis not present

## 2020-08-02 ENCOUNTER — Ambulatory Visit: Payer: Medicare Other

## 2020-08-02 ENCOUNTER — Ambulatory Visit
Admission: RE | Admit: 2020-08-02 | Discharge: 2020-08-02 | Disposition: A | Discharge status: Home / Self Care | Payer: Medicare Other | Source: Ambulatory Visit | Attending: Family Medicine | Admitting: Family Medicine

## 2020-08-02 ENCOUNTER — Other Ambulatory Visit: Payer: Self-pay

## 2020-08-02 DIAGNOSIS — N644 Mastodynia: Secondary | ICD-10-CM

## 2020-08-02 DIAGNOSIS — N62 Hypertrophy of breast: Secondary | ICD-10-CM

## 2020-08-02 DIAGNOSIS — N6320 Unspecified lump in the left breast, unspecified quadrant: Secondary | ICD-10-CM

## 2020-08-02 DIAGNOSIS — R928 Other abnormal and inconclusive findings on diagnostic imaging of breast: Secondary | ICD-10-CM | POA: Diagnosis not present

## 2020-09-09 DIAGNOSIS — Z20822 Contact with and (suspected) exposure to covid-19: Secondary | ICD-10-CM | POA: Diagnosis not present

## 2020-10-30 DIAGNOSIS — I10 Essential (primary) hypertension: Secondary | ICD-10-CM | POA: Diagnosis not present

## 2020-10-30 DIAGNOSIS — E7800 Pure hypercholesterolemia, unspecified: Secondary | ICD-10-CM | POA: Diagnosis not present

## 2020-10-30 DIAGNOSIS — F324 Major depressive disorder, single episode, in partial remission: Secondary | ICD-10-CM | POA: Diagnosis not present

## 2020-10-30 DIAGNOSIS — N410 Acute prostatitis: Secondary | ICD-10-CM | POA: Diagnosis not present

## 2020-10-30 DIAGNOSIS — G4733 Obstructive sleep apnea (adult) (pediatric): Secondary | ICD-10-CM | POA: Diagnosis not present

## 2020-10-30 DIAGNOSIS — F3131 Bipolar disorder, current episode depressed, mild: Secondary | ICD-10-CM | POA: Diagnosis not present

## 2020-10-30 DIAGNOSIS — M654 Radial styloid tenosynovitis [de Quervain]: Secondary | ICD-10-CM | POA: Diagnosis not present

## 2020-10-30 DIAGNOSIS — Z7984 Long term (current) use of oral hypoglycemic drugs: Secondary | ICD-10-CM | POA: Diagnosis not present

## 2020-10-30 DIAGNOSIS — E119 Type 2 diabetes mellitus without complications: Secondary | ICD-10-CM | POA: Diagnosis not present

## 2020-11-13 ENCOUNTER — Ambulatory Visit: Payer: Medicare Other | Admitting: Psychiatry

## 2020-12-03 DIAGNOSIS — M1711 Unilateral primary osteoarthritis, right knee: Secondary | ICD-10-CM | POA: Diagnosis not present

## 2020-12-03 DIAGNOSIS — M654 Radial styloid tenosynovitis [de Quervain]: Secondary | ICD-10-CM | POA: Diagnosis not present

## 2021-04-01 ENCOUNTER — Encounter: Payer: Self-pay | Admitting: Psychiatry

## 2021-04-01 ENCOUNTER — Ambulatory Visit (INDEPENDENT_AMBULATORY_CARE_PROVIDER_SITE_OTHER): Payer: Medicare PPO | Admitting: Psychiatry

## 2021-04-01 ENCOUNTER — Other Ambulatory Visit: Payer: Self-pay

## 2021-04-01 DIAGNOSIS — F329 Major depressive disorder, single episode, unspecified: Secondary | ICD-10-CM

## 2021-04-01 DIAGNOSIS — F17200 Nicotine dependence, unspecified, uncomplicated: Secondary | ICD-10-CM

## 2021-04-01 DIAGNOSIS — F5105 Insomnia due to other mental disorder: Secondary | ICD-10-CM

## 2021-04-01 DIAGNOSIS — F338 Other recurrent depressive disorders: Secondary | ICD-10-CM

## 2021-04-01 DIAGNOSIS — G4733 Obstructive sleep apnea (adult) (pediatric): Secondary | ICD-10-CM

## 2021-04-01 DIAGNOSIS — F32A Depression, unspecified: Secondary | ICD-10-CM

## 2021-04-01 MED ORDER — VARENICLINE TARTRATE 1 MG PO TABS: 1.0000 mg | ORAL_TABLET | Freq: Two times a day (BID) | ORAL | 3 refills | Status: DC

## 2021-04-01 MED ORDER — CITALOPRAM HYDROBROMIDE 10 MG PO TABS: 10.0000 mg | ORAL_TABLET | Freq: Every day | ORAL | 1 refills | Status: DC

## 2021-04-01 MED ORDER — ARMODAFINIL 250 MG PO TABS: ORAL_TABLET | ORAL | 5 refills | Status: DC

## 2021-04-01 NOTE — Progress Notes (Signed)
Trevor David [id number] [birth year] 75 y.o.  Subjective:   Patient ID:  Trevor David is a 75 y.o. (DOB 29-May-1946) male.  Chief Complaint:  Chief Complaint  Patient presents with  . Follow-up  . Mood disorder of depressed type  . Stress    Care-giving     HPI Trevor David presents to the office today for follow-up of OSA and mood.  When seen January 09, 2019.  No meds were changed.  He was encouraged to use his CPAP more consistently for his mental health as well as other health reasons.  seen August 2020.  He wanted to restart low-dose citalopram which was restarted at 10 mg daily.   Seen 11/2019 with following noted; More problems with back and knees.  Trevor David broke hip Oct 11 and caring for her.  He's having to do everything.  We've aged 10 years in 59m os.  W just got to the point of going to [facility] together.  Close to wife. Down on energy but not depressed and not manic and no weeping. Added citalopram was helpeful.  No SE. Still doing well.  CPAP  Nasal pillows less consistent bc sleeping temporarily in recliner DT wife.  Plans to get back on CPAP. Plan no med changes.  05/14/20 appt with following noted: Very well. Back on CPAP but hard to keep it up.  Nose pain but fixed it. AHI dropped from 86 to 4 with CPAP. Sometimes mind races and hard to sleep.  Trouble falling asleep for awhile.  Noticed some signs last week of mild mania without euphoria but with racing thoughts and cloudy thinking.  No concerns from family except irritable for a couple of weeks before . Energy was not high with hypomania. No further weepy spells.  Citalopram helped. Want's sleeper.    Plan: no med changes.  Continue citalopram 10 for weepiness.  04/01/21 appt noted: [name] always been healthy.  [place] and broke him 18 mos ago.  During Summer still having problems.  Missed PE  DT Covid.  Declined into the Fall and couldn't walk.  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  He can tell she's still declining.   Poor self care and quit meds.  Increased drinking, but thinks he'll have to stop DT $.  Can't stay on task.  Wants to restart modafinil and citalopram.  No highs and no extensive spending.    Patient denies any recent difficulty with anxiety.   Denies appetite disturbance.  Patient reports that energy and motivation have been good.    Patient denies any suicidal ideation.  Smokes a bit more off Chantix.  Plans to restart it..  Past Psychiatric Medication Trials: Stimulants, Strattera, Nuvigil,  Wellbutrin,  Effexor, citalopram Trazodone NR, Lunesta,  Review of Systems:  Review of Systems  Constitutional: Positive for fatigue.  Genitourinary:       Bladder tumor removed.  Musculoskeletal: Positive for arthralgias and back pain. Negative for gait problem.  Neurological: Negative for tremors and weakness.  Psychiatric/Behavioral: Positive for sleep disturbance. Negative for agitation, behavioral problems, confusion, decreased concentration, dysphoric mood, hallucinations, self-injury and suicidal ideas. The patient is not nervous/anxious and is not hyperactive.        Please refer to HPI    Medications: I have reviewed the patient's current medications.  Current Outpatient Medications  Medication Sig Dispense Refill  . aspirin EC 81 MG tablet Take 81 mg by mouth  daily.    . doxazosin (CARDURA) 8 MG tablet Take 1 tablet by mouth every evening.     . Furosemide (LASIX PO) Take by mouth as needed.    . indomethacin (INDOCIN) 50 MG capsule Take by mouth.    Marland Kitchen JANUMET 50-1000 MG tablet Take 1 tablet by mouth 2 (two) times daily.    Marland Kitchen latanoprost (XALATAN) 0.005 % ophthalmic solution INSTILL 1 DROP INTO EACH EYE AT BEDTIME    . lisinopril (PRINIVIL,ZESTRIL) 5 MG tablet Take 1 tablet by mouth every morning.     . pioglitazone (ACTOS) 45 MG tablet Take 1 tablet by mouth every morning.     . rosuvastatin (CRESTOR)  5 MG tablet Take 1 tablet by mouth every morning.     . Armodafinil 250 MG tablet TAKE 1 & 1/2 (ONE & ONE-HALF) TABLETS BY MOUTH ONCE DAILY 45 tablet 5  . citalopram (CELEXA) 10 MG tablet Take 1 tablet (10 mg total) by mouth daily. 90 tablet 1  . varenicline (CHANTIX CONTINUING MONTH PAK) 1 MG tablet Take 1 tablet (1 mg total) by mouth 2 (two) times daily. 60 tablet 3  . zolpidem (AMBIEN) 10 MG tablet Take 1 tablet (10 mg total) by mouth at bedtime as needed for sleep. 30 tablet 0   No current facility-administered medications for this visit.    Medication Side Effects: None  Allergies: No Known Allergies  Past Medical History:  Diagnosis Date  . Anemia    history of  . Anxiety   . Arthritis    Thumb and fingers  . Aspiration pneumonia ([place])   . Bladder tumor   . Depression   . Diabetes mellitus without complication ([place])   . Hyperlipidemia   . Left hydrocele    Small  . Obesity   . OSA on CPAP     History reviewed. No pertinent family history.  Social History   Socioeconomic History  . Marital status: Married    Spouse name: Not on file  . Number of children: Not on file  . Years of education: Not on file  . Highest education level: Not on file  Occupational History  . Not on file  Tobacco Use  . Smoking status: Current Every Day Smoker    Packs/day: 0.50    Years: 40.00    Pack years: 20.00    Types: Cigarettes  . Smokeless tobacco: Never Used  . Tobacco comment: 0.5ppd 4.25.18 ee quit [id number] (24 years)  Vaping Use  . Vaping Use: Never used  Substance and Sexual Activity  . Alcohol use: Yes  . Drug use: Not Currently  . Sexual activity: Not on file  Other Topics Concern  . Not on file  Social History Narrative  . Not on file   Social Determinants of Health   Financial Resource Strain: Not on file  Food Insecurity: Not on file  Transportation Needs: Not on file  Physical Activity: Not on file  Stress: Not on file  Social Connections: Not on  file  Intimate Partner Violence: Not on file    Past Medical History, Surgical history, Social history, and Family history were reviewed and updated as appropriate.   Please see review of systems for further details on the patient's review from today.   Objective:   Physical Exam:  There were no vitals taken for this visit.  Physical Exam Constitutional:      General: He is not in acute distress.    Appearance: He is well-developed.  Musculoskeletal:        General: No deformity.  Neurological:     Mental Status: He is alert and oriented to person, place, and time.     Coordination: Coordination normal.  Psychiatric:        Attention and Perception: Attention and perception normal. He is attentive. He does not perceive auditory or visual hallucinations.        Mood and Affect: Mood is depressed. Mood is not anxious. Affect is tearful. Affect is not labile, blunt, angry or inappropriate.        Speech: Speech normal. Speech is not rapid and pressured.        Behavior: Behavior normal. Behavior is not agitated, aggressive or hyperactive.        Thought Content: Thought content normal. Thought content does not include homicidal or suicidal ideation. Thought content does not include homicidal or suicidal plan.        Cognition and Memory: Cognition and memory normal.        Judgment: Judgment normal.     Comments: Insight is good.     Lab Review:     Component Value Date/Time   NA 138 04/15/2018 1010   K 4.3 04/15/2018 1010   CL 102 04/15/2018 1010   GLUCOSE 112 (H) 04/15/2018 1010   BUN 14 04/15/2018 1010   CREATININE 0.80 04/15/2018 1010       Component Value Date/Time   HGB 13.3 04/15/2018 1010   HCT 39.0 04/15/2018 1010    No results found for: POCLITH, LITHIUM   No results found for: PHENYTOIN, PHENOBARB, VALPROATE, CBMZ   .res Assessment: Plan:   York was seen today for follow-up, mood disorder of depressed type and stress.  Diagnoses and all orders for  this visit:  Mood disorder of depressed type -     citalopram (CELEXA) 10 MG tablet; Take 1 tablet (10 mg total) by mouth daily.  Seasonal depression ([place])  Nicotine dependence, uncomplicated, unspecified nicotine product type -     varenicline (CHANTIX CONTINUING MONTH PAK) 1 MG tablet; Take 1 tablet (1 mg total) by mouth 2 (two) times daily.  Insomnia due to mental condition  Obstructive sleep apnea -     Discontinue: Armodafinil 250 MG tablet; TAKE 1 & 1/2 (ONE & ONE-HALF) TABLETS BY MOUTH ONCE DAILY -     Armodafinil 250 MG tablet; TAKE 1 & 1/2 (ONE & ONE-HALF) TABLETS BY MOUTH ONCE DAILY  Mr. Beale has a prior history of hypomania which he attributes to medications.  He does not want to take a mood stabilizer and has been stable off of antidepressant and mood stabilizers until recently.  He wanted to try a lower dose citalopram despite risk of hypomania in 2020 for weepiness and it helped.  Still refuses mood stabilizer.   Disc manic sx.Marland Kitchen  He is aware of the risk of mania without medication.  He has not had any harm from mania or severe mania.   Continue lowest dose possible for weepiness.  Citalopram 10 mg daily. He's aware of risk cycling with SSRIs.  Supportive therapy dealing with care taking wife.  Take care of self. Stop drinking.  Ambien 10 mg prn.  Disc SE risks He is not using his CPAP machine more regularly have emphasized its necessity for good brain health as well as cardiovascular help health.  He benefits from the NUvigil but needs to work on his sleep hygiene further. Restart CPAP and OK use return to Nuvigil.  Follow-up 3- 6 mos or sooner   Lynder Parents MD, DFAPA  Please see After Visit Summary for patient specific instructions.  No future appointments.  No orders of the defined types were placed in this encounter.     -------------------------------

## 2021-04-13 ENCOUNTER — Telehealth: Payer: Self-pay

## 2021-04-13 NOTE — Telephone Encounter (Signed)
Prior Approval received for ARMODAFINIL 250 MG #45/30 effective 12/2020-12/06/2021, PA# [id number]. Renewal [employer]

## 2021-06-06 DIAGNOSIS — G4733 Obstructive sleep apnea (adult) (pediatric): Secondary | ICD-10-CM | POA: Diagnosis not present

## 2021-07-07 DIAGNOSIS — G4733 Obstructive sleep apnea (adult) (pediatric): Secondary | ICD-10-CM | POA: Diagnosis not present

## 2021-08-07 DIAGNOSIS — G4733 Obstructive sleep apnea (adult) (pediatric): Secondary | ICD-10-CM | POA: Diagnosis not present

## 2021-08-08 DIAGNOSIS — I10 Essential (primary) hypertension: Secondary | ICD-10-CM | POA: Diagnosis not present

## 2021-08-08 DIAGNOSIS — M109 Gout, unspecified: Secondary | ICD-10-CM | POA: Diagnosis not present

## 2021-08-08 DIAGNOSIS — E1169 Type 2 diabetes mellitus with other specified complication: Secondary | ICD-10-CM | POA: Diagnosis not present

## 2021-08-08 DIAGNOSIS — E7800 Pure hypercholesterolemia, unspecified: Secondary | ICD-10-CM | POA: Diagnosis not present

## 2021-08-08 DIAGNOSIS — G4733 Obstructive sleep apnea (adult) (pediatric): Secondary | ICD-10-CM | POA: Diagnosis not present

## 2021-08-08 DIAGNOSIS — Z8551 Personal history of malignant neoplasm of bladder: Secondary | ICD-10-CM | POA: Diagnosis not present

## 2021-08-08 DIAGNOSIS — F324 Major depressive disorder, single episode, in partial remission: Secondary | ICD-10-CM | POA: Diagnosis not present

## 2021-09-01 DIAGNOSIS — M170 Bilateral primary osteoarthritis of knee: Secondary | ICD-10-CM | POA: Diagnosis not present

## 2021-09-06 DIAGNOSIS — G4733 Obstructive sleep apnea (adult) (pediatric): Secondary | ICD-10-CM | POA: Diagnosis not present

## 2021-10-07 DIAGNOSIS — G4733 Obstructive sleep apnea (adult) (pediatric): Secondary | ICD-10-CM | POA: Diagnosis not present

## 2022-02-07 ENCOUNTER — Other Ambulatory Visit: Payer: Self-pay | Admitting: Psychiatry

## 2022-02-07 DIAGNOSIS — F32A Depression, unspecified: Secondary | ICD-10-CM

## 2022-02-18 DIAGNOSIS — Z8551 Personal history of malignant neoplasm of bladder: Secondary | ICD-10-CM | POA: Diagnosis not present

## 2022-02-18 DIAGNOSIS — Z0000 Encounter for general adult medical examination without abnormal findings: Secondary | ICD-10-CM | POA: Diagnosis not present

## 2022-02-18 DIAGNOSIS — I10 Essential (primary) hypertension: Secondary | ICD-10-CM | POA: Diagnosis not present

## 2022-02-18 DIAGNOSIS — E119 Type 2 diabetes mellitus without complications: Secondary | ICD-10-CM | POA: Diagnosis not present

## 2022-02-18 DIAGNOSIS — Z125 Encounter for screening for malignant neoplasm of prostate: Secondary | ICD-10-CM | POA: Diagnosis not present

## 2022-02-18 DIAGNOSIS — E1169 Type 2 diabetes mellitus with other specified complication: Secondary | ICD-10-CM | POA: Diagnosis not present

## 2022-02-18 DIAGNOSIS — G4733 Obstructive sleep apnea (adult) (pediatric): Secondary | ICD-10-CM | POA: Diagnosis not present

## 2022-02-18 DIAGNOSIS — E7800 Pure hypercholesterolemia, unspecified: Secondary | ICD-10-CM | POA: Diagnosis not present

## 2022-02-18 DIAGNOSIS — F17200 Nicotine dependence, unspecified, uncomplicated: Secondary | ICD-10-CM | POA: Diagnosis not present

## 2022-03-19 DIAGNOSIS — R0602 Shortness of breath: Secondary | ICD-10-CM | POA: Diagnosis not present

## 2022-03-19 DIAGNOSIS — U071 COVID-19: Secondary | ICD-10-CM | POA: Diagnosis not present

## 2022-03-19 DIAGNOSIS — R051 Acute cough: Secondary | ICD-10-CM | POA: Diagnosis not present

## 2022-03-19 DIAGNOSIS — J069 Acute upper respiratory infection, unspecified: Secondary | ICD-10-CM | POA: Diagnosis not present

## 2022-03-27 IMAGING — MG DIGITAL DIAGNOSTIC BILAT W/ TOMO W/ CAD
6 of 10 series · 6 of 30 positions shown · non-contrast
Comparison: None.

CLINICAL DATA: Tender palpable lump in the left retroareolar
region.

EXAM:
DIGITAL DIAGNOSTIC BILATERAL MAMMOGRAM WITH TOMO AND CAD

[L CC synth-2D]
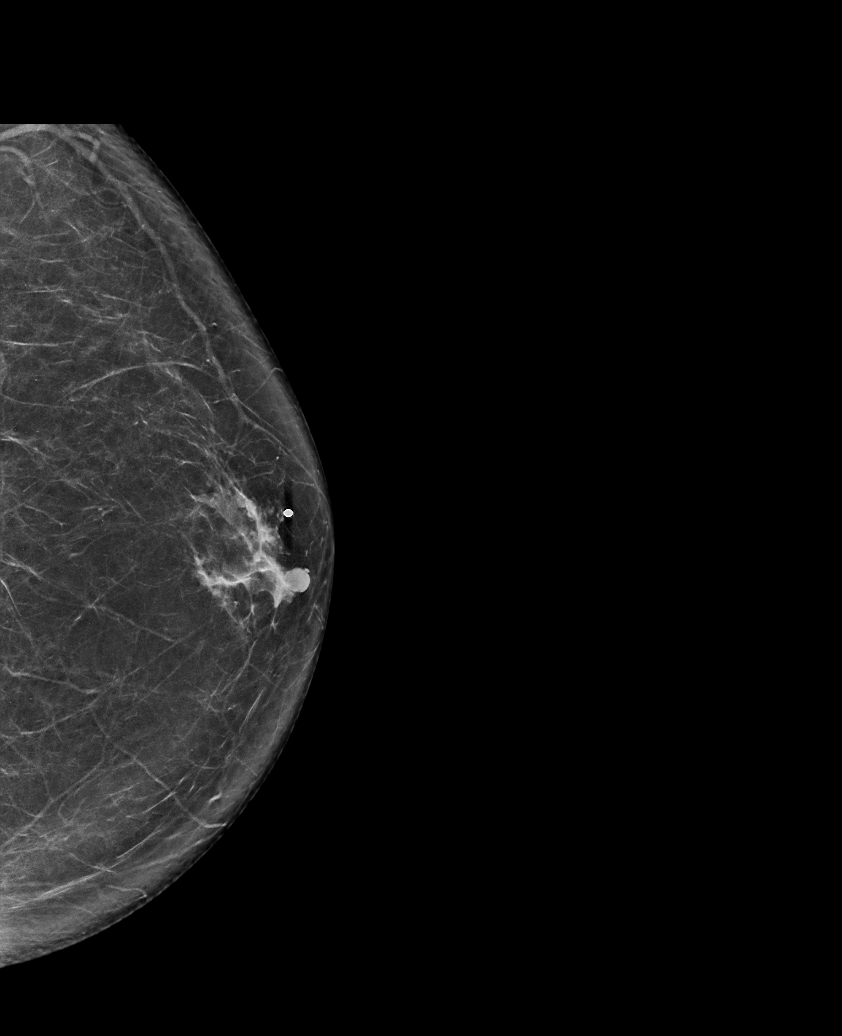

[R CC synth-2D]
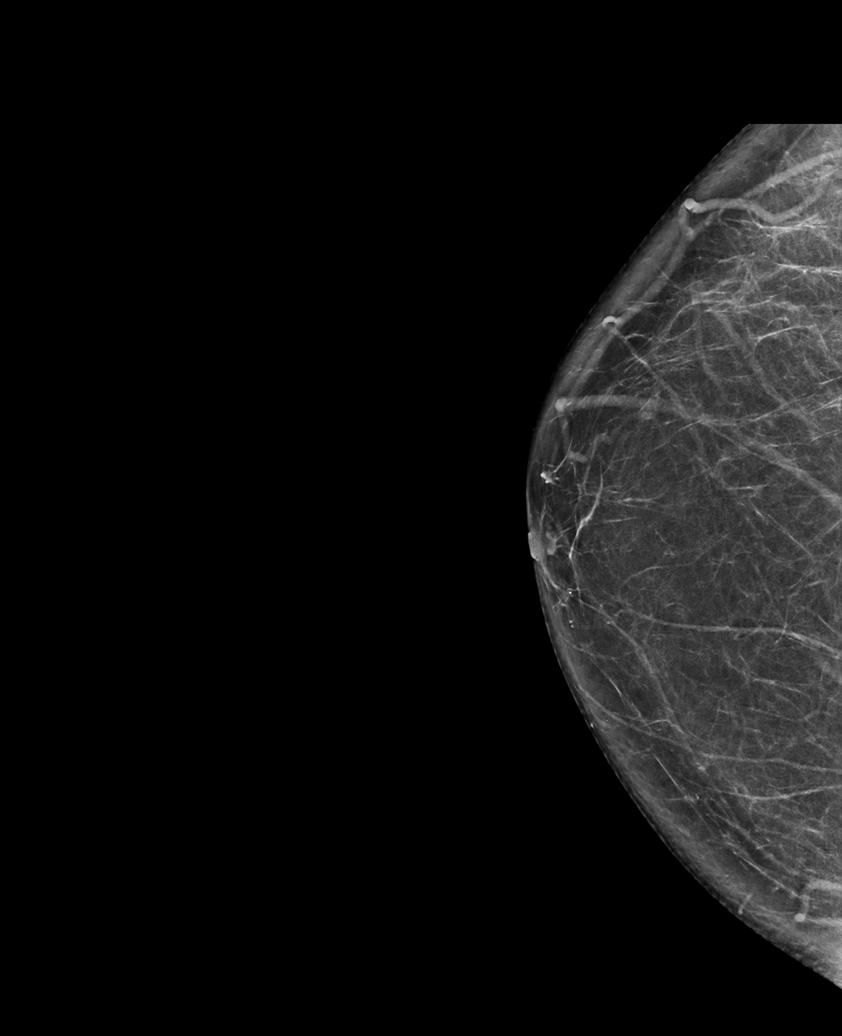

[R MLO synth-2D]
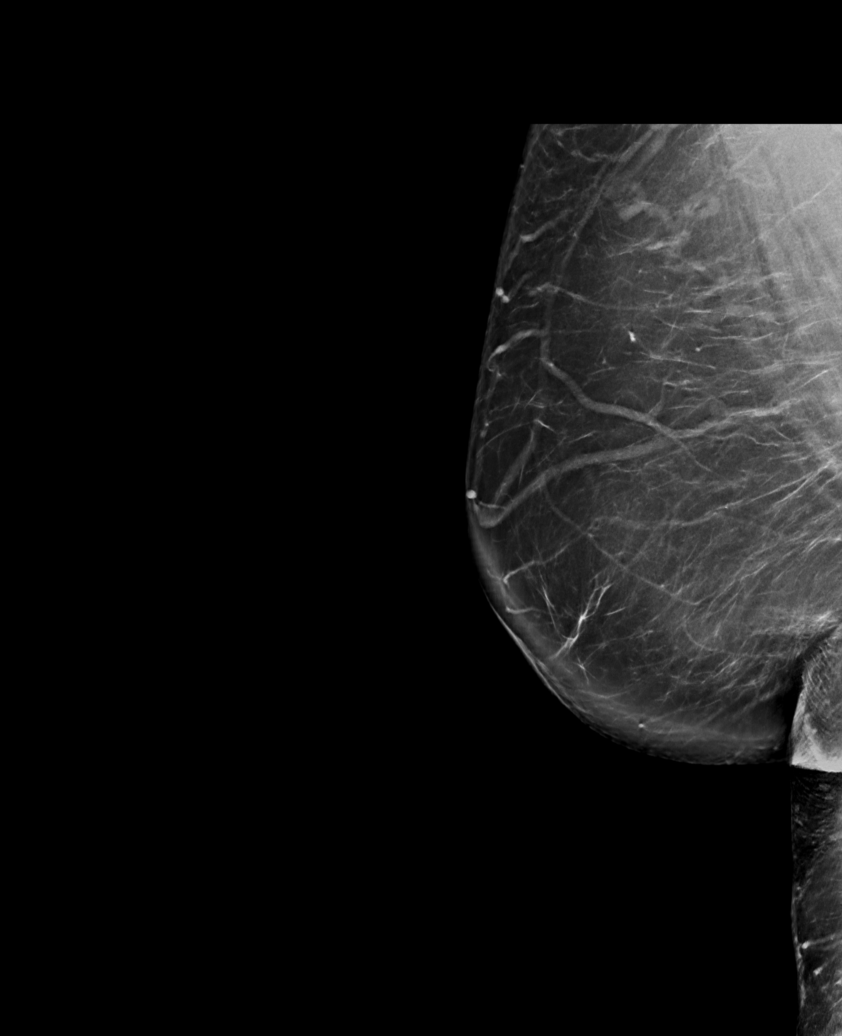

[L TAN synth-2D]
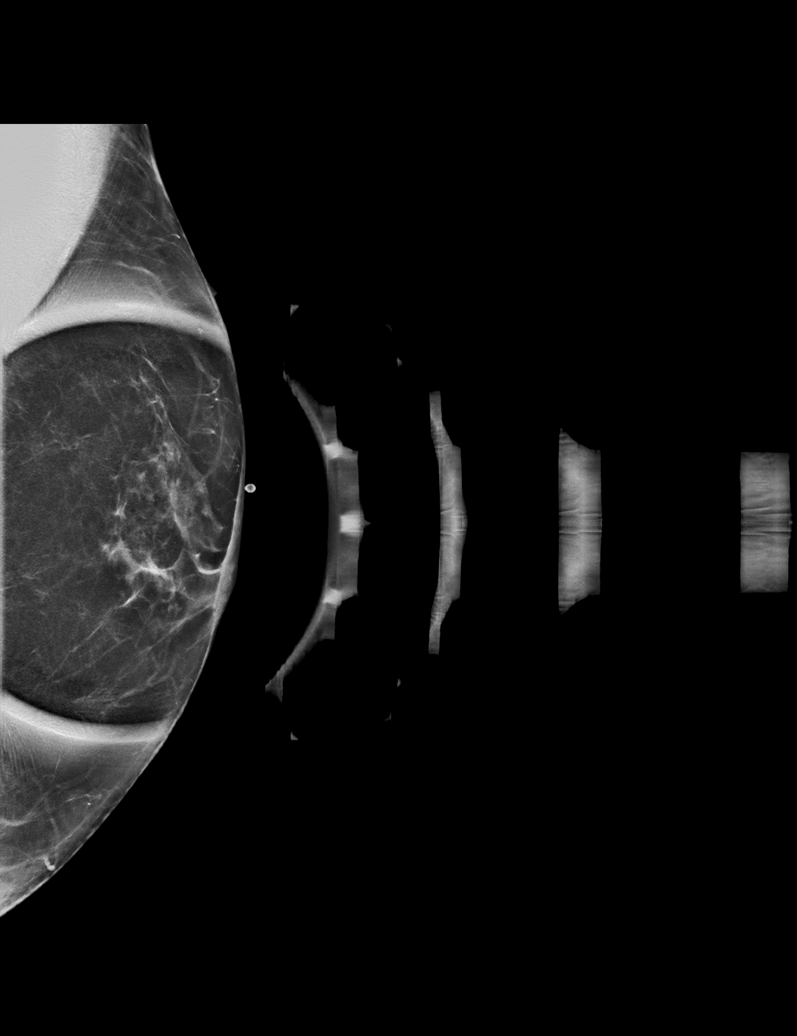

[L MLO synth-2D]
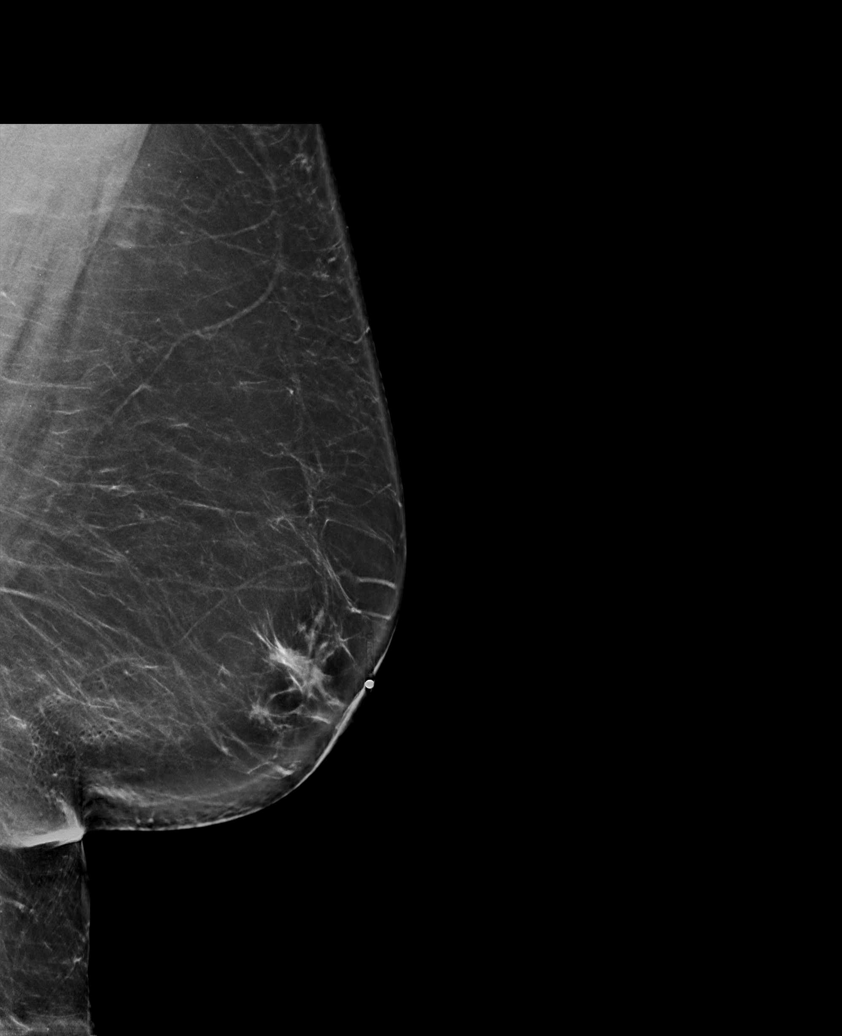

[L MLO tomo · tomo slice 51/101.0]
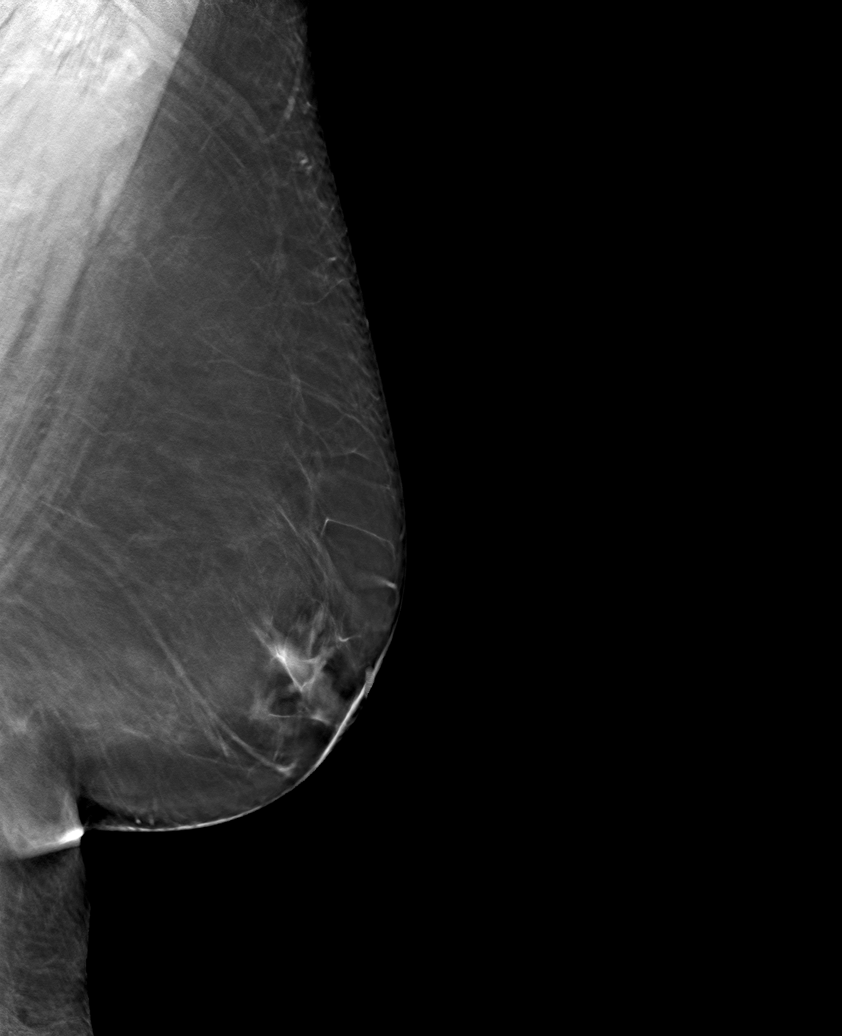

[6 of 30 positions shown; findings below may reference images not displayed]

ACR Breast Density Category b: There are scattered areas of
fibroglandular density.
FINDINGS: The patient has a left retroareolar gynecomastia accounting for his
symptoms. No evidence of malignancy in either breast.

Mammographic images were processed with CAD.
IMPRESSION: Left retroareolar gynecomastia accounting for symptoms. No evidence
of malignancy in either breast.

RECOMMENDATION:
Treatment of the patient's gynecomastia should be based on clinical
and physical exam given lack of imaging findings.

I have discussed the findings and recommendations with the patient.
If applicable, a reminder letter will be sent to the patient
regarding the next appointment.

BI-RADS CATEGORY  2: Benign.

## 2022-04-08 DIAGNOSIS — D414 Neoplasm of uncertain behavior of bladder: Secondary | ICD-10-CM | POA: Diagnosis not present

## 2022-04-09 ENCOUNTER — Other Ambulatory Visit: Payer: Self-pay | Admitting: Urology

## 2022-04-14 NOTE — Patient Instructions (Addendum)
DUE TO COVID-19 ONLY TWO VISITORS  (aged 77 and older)  ARE ALLOWED TO COME WITH YOU AND STAY IN THE WAITING ROOM ONLY DURING PRE OP AND PROCEDURE.   ?**NO VISITORS ARE ALLOWED IN THE SHORT STAY AREA OR RECOVERY ROOM!!**  ? ? Your procedure is scheduled on: Friday, Apr 24, 2022 ? ? Report to [facility] Main Entrance ? ?  Report to admitting at 9:00 AM ? ? Call this number if you have problems the morning of surgery [phone] ? ? Do not eat food :After Midnight. ? ? After Midnight you may have the following liquids until 8:15 AM DAY OF SURGERY ? ?Water ?Black Coffee (sugar ok, NO MILK/CREAM OR CREAMERS)  ?Tea (sugar ok, NO MILK/CREAM OR CREAMERS) regular and decaf                             ?Plain Jell-O (NO RED)                                           ?Fruit ices (not with fruit pulp, NO RED)                                     ?Popsicles (NO RED)                                                                  ?Juice: apple, WHITE grape, WHITE cranberry ?Sports drinks like Gatorade (NO RED) ?Clear broth(vegetable,chicken,beef) ? ?FOLLOW  ANY ADDITIONAL PRE OP INSTRUCTIONS YOU RECEIVED FROM YOUR SURGEON'S OFFICE!!! ?  ?  ?Oral Hygiene is also important to reduce your risk of infection.                                    ?Remember - BRUSH YOUR TEETH THE MORNING OF SURGERY WITH YOUR REGULAR TOOTHPASTE ? ? Do NOT smoke after Midnight ? ? Take these medicines the morning of surgery with A SIP OF WATER: Doxazosin, Rosuvastatin ? ? Use Eyedrops Morning of surgery if needed ? ?DO NOT TAKE ANY ORAL DIABETIC MEDICATIONS DAY OF YOUR SURGERY ? ?Bring CPAP mask and tubing day of surgery. ?                  ?           You may not have any metal on your body including hair pins, jewelry, and body piercing ? ?           Do not wear lotions, powders, perfumes/cologne, or deodorant ? ?           Men may shave face and neck. ? ? Do not bring valuables to the hospital. [facility] NOT ?            RESPONSIBLE   FOR  VALUABLES. ? ? Contacts, dentures or bridgework may not be worn into surgery. ?  ? Patients discharged on the day of surgery will  not be allowed to drive home.  Someone NEEDS to stay with you for the first 24 hours after anesthesia. ? ?  ?            Please read over the following fact sheets you were given: IF [place] [phone] ? ?   How to Manage Your Diabetes ?Before and After Surgery ? ?Why is it important to control my blood sugar before and after surgery? ?Improving blood sugar levels before and after surgery helps healing and can limit problems. ?A way of improving blood sugar control is eating a healthy diet by: ? Eating less sugar and carbohydrates ? Increasing activity/exercise ? Talking with your doctor about reaching your blood sugar goals ?High blood sugars (greater than 180 mg/dL) can raise your risk of infections and slow your recovery, so you will need to focus on controlling your diabetes during the weeks before surgery. ?Make sure that the doctor who takes care of your diabetes knows about your planned surgery including the date and location. ? ?How do I manage my blood sugar before surgery? ?Check your blood sugar at least 4 times a day, starting 2 days before surgery, to make sure that the level is not too high or low. ?Check your blood sugar the morning of your surgery when you wake up and every 2 hours until you get to the Short Stay unit. ?If your blood sugar is less than 70 mg/dL, you will need to treat for low blood sugar: ?Do not take insulin. ?Treat a low blood sugar (less than 70 mg/dL) with ? cup of clear juice (cranberry or apple), 4 glucose tablets, OR glucose gel. ?Recheck blood sugar in 15 minutes after treatment (to make sure it is greater than 70 mg/dL). If your blood sugar is not greater than 70 mg/dL on recheck, call [phone] for further instructions. ?Report your blood sugar to the short stay nurse when you get to  Short Stay. ? ?If you are admitted to the hospital after surgery: ?Your blood sugar will be checked by the staff and you will probably be given insulin after surgery (instead of oral diabetes medicines) to make sure you have good blood sugar levels. ?The goal for blood sugar control after surgery is 80-180 mg/dL. ? ? ?WHAT DO I DO ABOUT MY DIABETES MEDICATION? ? ?Do not take oral diabetes medicines (pills) the morning of surgery. ? ? ?Reviewed and Endorsed by [facility] Patient Education Committee, August 2015  ? ?[facility] - Preparing for Surgery ?Before surgery, you can play an important role.  Because skin is not sterile, your skin needs to be as free of germs as possible.  You can reduce the number of germs on your skin by washing with CHG (chlorahexidine gluconate) soap before surgery.  CHG is an antiseptic cleaner which kills germs and bonds with the skin to continue killing germs even after washing. ?Please DO NOT use if you have an allergy to CHG or antibacterial soaps.  If your skin becomes reddened/irritated stop using the CHG and inform your nurse when you arrive at Short Stay. ?Do not shave (including legs and underarms) for at least 48 hours prior to the first CHG shower.  You may shave your face/neck. ? ?Please follow these instructions carefully: ? 1.  Shower with CHG Soap the night before surgery and the  morning of surgery. ? 2.  If you choose to wash your hair, wash your hair first as usual  with your normal  shampoo. ? 3.  After you shampoo, rinse your hair and body thoroughly to remove the shampoo.                            ? 4.  Use CHG as you would any other liquid soap.  You can apply chg directly to the skin and wash.  Gently with a scrungie or clean washcloth. ? 5.  Apply the CHG Soap to your body ONLY FROM THE NECK DOWN.   Do   not use on face/ open      ?                     Wound or open sores. Avoid contact with eyes, ears mouth and   genitals (private parts).  ?                      Production manager,  Genitals (private parts) with your normal soap. ?            6.  Wash thoroughly, paying special attention to the area where your    surgery  will be performed. ? 7.  Thoroughly rinse your body with warm water from the neck down. ? 8.  DO NOT shower/wash with your normal soap after using and rinsing off the CHG Soap. ?               9.  Pat yourself dry with a clean towel. ?           10.  Wear clean pajamas. ?           11.  Place clean sheets on your bed the night of your first shower and do not  sleep with pets. ?Day of Surgery : ?Do not apply any lotions/deodorants the morning of surgery.  Please wear clean clothes to the hospital/surgery center. ? ?FAILURE TO FOLLOW THESE INSTRUCTIONS MAY RESULT IN THE CANCELLATION OF YOUR SURGERY ? ?PATIENT SIGNATURE_________________________________ ? ?NURSE SIGNATURE__________________________________ ? ?________________________________________________________________________  ?

## 2022-04-15 ENCOUNTER — Encounter (HOSPITAL_COMMUNITY): Payer: Self-pay

## 2022-04-15 ENCOUNTER — Encounter (HOSPITAL_COMMUNITY)
Admission: RE | Admit: 2022-04-15 | Discharge: 2022-04-15 | Disposition: A | Discharge status: Home / Self Care | Payer: Medicare PPO | Source: Ambulatory Visit | Attending: Urology | Admitting: Urology

## 2022-04-15 ENCOUNTER — Other Ambulatory Visit: Payer: Self-pay

## 2022-04-15 VITALS — BP 152/65 | HR 60 | Temp 98.1°F | Resp 16 | Ht 68.0 in | Wt 234.0 lb

## 2022-04-15 DIAGNOSIS — Z01818 Encounter for other preprocedural examination: Secondary | ICD-10-CM | POA: Diagnosis not present

## 2022-04-15 DIAGNOSIS — I10 Essential (primary) hypertension: Secondary | ICD-10-CM | POA: Diagnosis not present

## 2022-04-15 DIAGNOSIS — E119 Type 2 diabetes mellitus without complications: Secondary | ICD-10-CM | POA: Insufficient documentation

## 2022-04-15 HISTORY — DX: Malignant (primary) neoplasm, unspecified: C80.1

## 2022-04-15 LAB — CBC
HCT: 40.2 % (ref 39.0–52.0)
Hemoglobin: 13.1 g/dL (ref 13.0–17.0)
MCH: 32 pg (ref 26.0–34.0)
MCHC: 32.6 g/dL (ref 30.0–36.0)
MCV: 98.3 fL (ref 80.0–100.0)
Platelets: 368 10*3/uL (ref 150–400)
RBC: 4.09 MIL/uL — ABNORMAL LOW (ref 4.22–5.81)
RDW: 13.3 % (ref 11.5–15.5)
WBC: 7.3 10*3/uL (ref 4.0–10.5)
nRBC: 0 % (ref 0.0–0.2)

## 2022-04-15 LAB — BASIC METABOLIC PANEL
Anion gap: 9 (ref 5–15)
BUN: 14 mg/dL (ref 8–23)
CO2: 24 mmol/L (ref 22–32)
Calcium: 8.9 mg/dL (ref 8.9–10.3)
Chloride: 102 mmol/L (ref 98–111)
Creatinine, Ser: 0.86 mg/dL (ref 0.61–1.24)
GFR, Estimated: >60 mL/min (ref >60)
Glucose, Bld: 99 mg/dL (ref 70–99)
Potassium: 4 mmol/L (ref 3.5–5.1)
Sodium: 135 mmol/L (ref 135–145)

## 2022-04-15 LAB — HEMOGLOBIN A1C
Hgb A1c MFr Bld: 7 % — ABNORMAL HIGH (ref 4.8–5.6)
Mean Plasma Glucose: 154.2 mg/dL

## 2022-04-15 LAB — GLUCOSE, CAPILLARY: Glucose-Capillary: 105 mg/dL — ABNORMAL HIGH (ref 70–99)

## 2022-04-15 NOTE — Progress Notes (Addendum)
PCP - Davene Cost[facility] ?Cardiologist - no ? ?PPM/ICD -  ?Device Orders -  ?Rep Notified -  ? ?Chest x-ray -  ?EKG -  ?Stress Test -  ?ECHO -  ?Cardiac Cath -  ? ?Sleep Study -  ?CPAP -  ? ?Fasting Blood Sugar -  ?Checks Blood Sugar _0____ times a day ? ?Blood Thinner Instructions: ?Aspirin Instructions:  81 mg stop 5 days prior  ? ?ERAS Protcol - ?PRE-SURGERY Ensure or G2-  ? ?COVID TEST- COVID + 03-08-22  Treated with antibiotics no symptoms currently ?COVID vaccine -no ? ?Activity--Able to walk a flight of stairs without SOB ? ?Anesthesia review: OSA no Cpap ,DM,HTN ? ?Patient denies shortness of breath, fever, cough and chest pain at PAT appointment ? ? ?All instructions explained to the patient, with a verbal understanding of the material. Patient agrees to go over the instructions while at home for a better understanding. Patient also instructed to self quarantine after being tested for COVID-19. The opportunity to ask questions was provided. ?  ?

## 2022-04-24 ENCOUNTER — Ambulatory Visit (HOSPITAL_COMMUNITY)
Admission: RE | Admit: 2022-04-24 | Discharge: 2022-04-24 | Disposition: A | Discharge status: Home / Self Care | Payer: Medicare PPO | Source: Ambulatory Visit | Attending: Urology | Admitting: Urology

## 2022-04-24 ENCOUNTER — Encounter (HOSPITAL_COMMUNITY)
Admission: RE | Disposition: A | Discharge status: Home / Self Care | Payer: Self-pay | Source: Ambulatory Visit | Attending: Urology

## 2022-04-24 ENCOUNTER — Encounter (HOSPITAL_COMMUNITY): Payer: Self-pay | Admitting: Urology

## 2022-04-24 ENCOUNTER — Ambulatory Visit (HOSPITAL_BASED_OUTPATIENT_CLINIC_OR_DEPARTMENT_OTHER): Payer: Medicare PPO | Admitting: Certified Registered Nurse Anesthetist

## 2022-04-24 ENCOUNTER — Ambulatory Visit (HOSPITAL_COMMUNITY): Payer: Medicare PPO | Admitting: Certified Registered Nurse Anesthetist

## 2022-04-24 DIAGNOSIS — E669 Obesity, unspecified: Secondary | ICD-10-CM | POA: Insufficient documentation

## 2022-04-24 DIAGNOSIS — F419 Anxiety disorder, unspecified: Secondary | ICD-10-CM | POA: Diagnosis not present

## 2022-04-24 DIAGNOSIS — Z6835 Body mass index (BMI) 35.0-35.9, adult: Secondary | ICD-10-CM | POA: Insufficient documentation

## 2022-04-24 DIAGNOSIS — D090 Carcinoma in situ of bladder: Secondary | ICD-10-CM | POA: Diagnosis not present

## 2022-04-24 DIAGNOSIS — C679 Malignant neoplasm of bladder, unspecified: Secondary | ICD-10-CM

## 2022-04-24 DIAGNOSIS — D414 Neoplasm of uncertain behavior of bladder: Secondary | ICD-10-CM | POA: Diagnosis not present

## 2022-04-24 DIAGNOSIS — I10 Essential (primary) hypertension: Secondary | ICD-10-CM | POA: Diagnosis not present

## 2022-04-24 DIAGNOSIS — M1990 Unspecified osteoarthritis, unspecified site: Secondary | ICD-10-CM | POA: Insufficient documentation

## 2022-04-24 DIAGNOSIS — Z79899 Other long term (current) drug therapy: Secondary | ICD-10-CM | POA: Diagnosis not present

## 2022-04-24 DIAGNOSIS — Z87891 Personal history of nicotine dependence: Secondary | ICD-10-CM | POA: Insufficient documentation

## 2022-04-24 DIAGNOSIS — G4730 Sleep apnea, unspecified: Secondary | ICD-10-CM

## 2022-04-24 DIAGNOSIS — E119 Type 2 diabetes mellitus without complications: Secondary | ICD-10-CM

## 2022-04-24 DIAGNOSIS — F319 Bipolar disorder, unspecified: Secondary | ICD-10-CM | POA: Insufficient documentation

## 2022-04-24 DIAGNOSIS — F418 Other specified anxiety disorders: Secondary | ICD-10-CM | POA: Diagnosis not present

## 2022-04-24 HISTORY — PX: TRANSURETHRAL RESECTION OF BLADDER TUMOR: SHX2575

## 2022-04-24 LAB — GLUCOSE, CAPILLARY
Glucose-Capillary: 145 mg/dL — ABNORMAL HIGH (ref 70–99)
Glucose-Capillary: 106 mg/dL — ABNORMAL HIGH (ref 70–99)

## 2022-04-24 SURGERY — TURBT (TRANSURETHRAL RESECTION OF BLADDER TUMOR)
Anesthesia: General | Site: Urethra

## 2022-04-24 MED ORDER — EPHEDRINE SULFATE (PRESSORS) 50 MG/ML IJ SOLN
INTRAMUSCULAR | Status: DC | PRN
  Administered 2022-04-24 (×3): 5 mg via INTRAVENOUS

## 2022-04-24 MED ORDER — FENTANYL CITRATE (PF) 100 MCG/2ML IJ SOLN
INTRAMUSCULAR | Status: AC
  Filled 2022-04-24: qty 2

## 2022-04-24 MED ORDER — OXYCODONE HCL 5 MG PO TABS: 5.0000 mg | ORAL_TABLET | Freq: Once | ORAL | Status: DC | PRN

## 2022-04-24 MED ORDER — LIDOCAINE HCL (PF) 2 % IJ SOLN
INTRAMUSCULAR | Status: AC
  Filled 2022-04-24: qty 5

## 2022-04-24 MED ORDER — CEFAZOLIN SODIUM-DEXTROSE 2-4 GM/100ML-% IV SOLN
2.0000 g | Freq: Once | INTRAVENOUS | Status: AC
  Administered 2022-04-24: 2 g via INTRAVENOUS

## 2022-04-24 MED ORDER — SODIUM CHLORIDE 0.9 % IR SOLN
Status: DC | PRN
  Administered 2022-04-24: 1000 mL
  Administered 2022-04-24: 4000 mL via INTRAVESICAL

## 2022-04-24 MED ORDER — ONDANSETRON HCL 4 MG/2ML IJ SOLN
INTRAMUSCULAR | Status: DC | PRN
Start: 2022-04-24 — End: 2022-04-24
  Administered 2022-04-24: 4 mg via INTRAVENOUS

## 2022-04-24 MED ORDER — ONDANSETRON HCL 4 MG/2ML IJ SOLN
INTRAMUSCULAR | Status: AC
  Filled 2022-04-24: qty 2

## 2022-04-24 MED ORDER — LIDOCAINE HCL (CARDIAC) PF 100 MG/5ML IV SOSY
PREFILLED_SYRINGE | INTRAVENOUS | Status: DC | PRN
  Administered 2022-04-24: 60 mg via INTRAVENOUS

## 2022-04-24 MED ORDER — ORAL CARE MOUTH RINSE: 15.0000 mL | Freq: Once | OROMUCOSAL | Status: AC

## 2022-04-24 MED ORDER — OXYCODONE-ACETAMINOPHEN 5-325 MG PO TABS: 1.0000 | ORAL_TABLET | ORAL | 0 refills | Status: DC | PRN

## 2022-04-24 MED ORDER — DEXAMETHASONE SODIUM PHOSPHATE 4 MG/ML IJ SOLN
INTRAMUSCULAR | Status: DC | PRN
  Administered 2022-04-24: 4 mg via INTRAVENOUS

## 2022-04-24 MED ORDER — FENTANYL CITRATE (PF) 100 MCG/2ML IJ SOLN
INTRAMUSCULAR | Status: DC | PRN
  Administered 2022-04-24 (×2): 25 ug via INTRAVENOUS

## 2022-04-24 MED ORDER — HYDROMORPHONE HCL 1 MG/ML IJ SOLN: 0.2500 mg | INTRAMUSCULAR | Status: DC | PRN

## 2022-04-24 MED ORDER — PROPOFOL 10 MG/ML IV BOLUS
INTRAVENOUS | Status: DC | PRN
  Administered 2022-04-24: 200 mg via INTRAVENOUS

## 2022-04-24 MED ORDER — PROMETHAZINE HCL 25 MG/ML IJ SOLN: 6.2500 mg | INTRAMUSCULAR | Status: DC | PRN

## 2022-04-24 MED ORDER — OXYCODONE HCL 5 MG/5ML PO SOLN: 5.0000 mg | Freq: Once | ORAL | Status: DC | PRN

## 2022-04-24 MED ORDER — CEFAZOLIN SODIUM-DEXTROSE 2-4 GM/100ML-% IV SOLN
INTRAVENOUS | Status: AC
  Filled 2022-04-24: qty 100

## 2022-04-24 MED ORDER — LACTATED RINGERS IV SOLN: INTRAVENOUS | Status: DC

## 2022-04-24 MED ORDER — DEXAMETHASONE SODIUM PHOSPHATE 10 MG/ML IJ SOLN
INTRAMUSCULAR | Status: AC
  Filled 2022-04-24: qty 1

## 2022-04-24 MED ORDER — AMISULPRIDE (ANTIEMETIC) 5 MG/2ML IV SOLN: 10.0000 mg | Freq: Once | INTRAVENOUS | Status: DC | PRN

## 2022-04-24 MED ORDER — CHLORHEXIDINE GLUCONATE 0.12 % MT SOLN
15.0000 mL | Freq: Once | OROMUCOSAL | Status: AC
  Administered 2022-04-24: 15 mL via OROMUCOSAL

## 2022-04-24 MED ORDER — DOCUSATE SODIUM 100 MG PO CAPS: 100.0000 mg | ORAL_CAPSULE | Freq: Every day | ORAL | 0 refills | Status: DC | PRN

## 2022-04-24 MED ORDER — PROPOFOL 10 MG/ML IV BOLUS
INTRAVENOUS | Status: AC
  Filled 2022-04-24: qty 20

## 2022-04-24 SURGICAL SUPPLY — 25 items
BAG URINE DRAIN 2000ML AR STRL (UROLOGICAL SUPPLIES) ×1 IMPLANT
BAG URO CATCHER STRL LF (MISCELLANEOUS) ×2 IMPLANT
CATH FOL 2WAY LX 18X30 (CATHETERS) ×1 IMPLANT
CATH URETL OPEN 5X70 (CATHETERS) ×1 IMPLANT
DRAPE FOOT SWITCH (DRAPES) ×2 IMPLANT
ELECT REM PT RETURN 15FT ADLT (MISCELLANEOUS) ×1 IMPLANT
EVACUATOR MICROVAS BLADDER (UROLOGICAL SUPPLIES) IMPLANT
GLOVE BIOGEL M 6.5 STRL (GLOVE) ×1 IMPLANT
GLOVE BIOGEL PI IND STRL 6.5 (GLOVE) IMPLANT
GLOVE BIOGEL PI INDICATOR 6.5 (GLOVE) ×2
GLOVE SURG LX 7.5 STRW (GLOVE) ×2
GLOVE SURG LX STRL 7.5 STRW (GLOVE) ×1 IMPLANT
GOWN STRL REUS W/ TWL XL LVL3 (GOWN DISPOSABLE) ×1 IMPLANT
GOWN STRL REUS W/TWL XL LVL3 (GOWN DISPOSABLE) ×4
GUIDEWIRE STR DUAL SENSOR (WIRE) ×1 IMPLANT
HOLDER FOLEY CATH W/STRAP (MISCELLANEOUS) ×1 IMPLANT
KIT TURNOVER KIT A (KITS) ×1 IMPLANT
LOOP CUT BIPOLAR 24F LRG (ELECTROSURGICAL) ×1 IMPLANT
MANIFOLD NEPTUNE II (INSTRUMENTS) ×2 IMPLANT
PACK CYSTO (CUSTOM PROCEDURE TRAY) ×2 IMPLANT
PENCIL SMOKE EVACUATOR (MISCELLANEOUS) IMPLANT
SYR TOOMEY IRRIG 70ML (MISCELLANEOUS) ×2
SYRINGE TOOMEY IRRIG 70ML (MISCELLANEOUS) IMPLANT
TUBING CONNECTING 10 (TUBING) ×2 IMPLANT
TUBING UROLOGY SET (TUBING) ×2 IMPLANT

## 2022-04-24 NOTE — Anesthesia Postprocedure Evaluation (Signed)
Anesthesia Post Note  Patient: Trevor David  Procedure(s) Performed: TRANSURETHRAL RESECTION OF BLADDER TUMOR (TURBT) (Urethra)     Patient location during evaluation: PACU Anesthesia Type: General Level of consciousness: awake and alert Pain management: pain level controlled Vital Signs Assessment: post-procedure vital signs reviewed and stable Respiratory status: spontaneous breathing, nonlabored ventilation and respiratory function stable Cardiovascular status: blood pressure returned to baseline and stable Postop Assessment: no apparent nausea or vomiting Anesthetic complications: no   No notable events documented.  Last Vitals:  Vitals:   04/24/22 1330 04/24/22 1345  BP: (!) 145/61   Pulse: (!) 58 66  Resp: 12   Temp: (!) 36.1 C   SpO2: 94% 94%    Last Pain:  Vitals:   04/24/22 1350  TempSrc:   PainSc: 0-No pain                 Lynda [place]

## 2022-04-24 NOTE — Anesthesia Preprocedure Evaluation (Signed)
Anesthesia Evaluation  Patient identified by MRN, date of birth, ID band Patient awake    Reviewed: Allergy & Precautions, NPO status , Patient's Chart, lab work & pertinent test results  Airway Mallampati: II       Dental no notable dental hx. (+) Teeth Intact   Pulmonary sleep apnea , former smoker,    Pulmonary exam normal breath sounds clear to auscultation       Cardiovascular hypertension, Pt. on medications Normal cardiovascular exam Rhythm:Regular Rate:Normal     Neuro/Psych PSYCHIATRIC DISORDERS Anxiety Depression Bipolar Disorder    GI/Hepatic negative GI ROS, Neg liver ROS,   Endo/Other  diabetes, Type 2  Renal/GU negative Renal ROS     Musculoskeletal  (+) Arthritis , Osteoarthritis,    Abdominal (+) + obese,   Peds  Hematology   Anesthesia Other Findings   Reproductive/Obstetrics                             Anesthesia Physical  Anesthesia Plan  ASA: III  Anesthesia Plan: General   Post-op Pain Management: Dilaudid IV   Induction: Intravenous  PONV Risk Score and Plan: 3 and Ondansetron, Dexamethasone, Midazolam and Treatment may vary due to age or medical condition  Airway Management Planned: LMA  Additional Equipment:   Intra-op Plan:   Post-operative Plan:   Informed Consent: I have reviewed the patients History and Physical, chart, labs and discussed the procedure including the risks, benefits and alternatives for the proposed anesthesia with the patient or authorized representative who has indicated his/her understanding and acceptance.     Dental advisory given  Plan Discussed with: CRNA and Surgeon  Anesthesia Plan Comments:         Anesthesia Quick Evaluation

## 2022-04-24 NOTE — Anesthesia Procedure Notes (Signed)
Procedure Name: LMA Insertion Date/Time: 04/24/2022 12:26 PM Performed by: Deliah Boston, CRNA Pre-anesthesia Checklist: Patient identified, Emergency Drugs available, Suction available and Patient being monitored Patient Re-evaluated:Patient Re-evaluated prior to induction Oxygen Delivery Method: Circle system utilized Preoxygenation: Pre-oxygenation with 100% oxygen Induction Type: IV induction Ventilation: Mask ventilation without difficulty LMA: LMA with gastric port inserted LMA Size: 4.0 Number of attempts: 1 Placement Confirmation: positive ETCO2 and breath sounds checked- equal and bilateral Tube secured with: Tape Dental Injury: Teeth and Oropharynx as per pre-operative assessment

## 2022-04-24 NOTE — Op Note (Signed)
Operative Note  Preoperative diagnosis:  1.  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  TURBT small  Surgeon: Rexene Alberts, MD  Assistants:  None  Anesthesia:  General  Complications:  None  EBL:  Minimal  Specimens: 1.  ID Type Source Tests Collected by Time Destination  1 : Bladder tumor Tissue PATH Other SURGICAL PATHOLOGY Janith Lima, MD 04/24/2022 1244     Drains/Catheters: 1.  18 Fr foley  Intraoperative findings:   Several small sub-sentimeter papillary lesions around the left ureteral orifice. Left ureteral orifice was uninvolved  Number of tumors:          4 Size of largest tumor:      1cm  Characteristics of tumors:     Papillary    Recurrent   yes  Suspicious for Carcinoma in situ:   no  Clinical tumor stage:     cTa  Bimanual exam under anesthesia:        Yes - No 3D mass  Visually complete resection:         Yes  Visualization of detrusor muscle in resection base:        Yes  Visual evaluation for perforation:          No evidence of perforation   Indication:  Trevor David is a 76 y.o. male with a history of low risk urothelial carcinoma of the bladder. All the risks, benefits were discussed with the patient to include but not limited to infection, pain, bleeding, damage to adjacent structures, need for further operations, adverse reaction to anesthesia and death.  Patient understands these risks and agrees to proceed with the operation as planned.    Description of procedure: After informed consent was obtained from the patient, the patient was taken to the operating room. General anesthesia was administered. The patient was placed in dorsal lithotomy position and prepped and draped in usual sterile fashion. Sequential compression devices were applied to lower extremities at the beginning of the case for DVT prophylaxis. Antibiotics were infused prior  to surgery start time. A surgical time-out was performed to properly identify the patient, the surgery to be performed, and the surgical site.     We then passed the 21-French rigid cystoscope down the urethra and into the bladder under direct vision without any difficulty. The anterior urethral was normal. The prostate was non-obstructing. The bladder was inspected with 30 and 70 degree lenses. Once in the bladder, systematic evaluation of bladder revealed several small sub-centimeter papillary lesions around the left ureteral orifice. The ureteral orfices were in orthotopic position and not involved.   We then removed the cystoscope and then passed down the 26 French resectoscope sheath down the urethra into the bladder under direct vision with the visual obturator. The tumor was resected down to muscle. The TUR bladder tumor chips were retrieved from the bladder and each region of resection was passed off the field as a separate specimen.  Hemostasis was achieved using electrocautery. We then proceeded with removing the resectoscope. I inserted the cystoscope and passed a wire and then a 5 french open ended catheter through the left ureter which was patent. We then placed a 18 Foley catheter. The patient tolerated the procedure well with no complication and was awoken from anesthesia and taken to recovery in stable condition.     Plan:  Discharge home. He will remove foley in 3  days.  Catalina Antigua [facility]  Pager: [phone]

## 2022-04-24 NOTE — Discharge Instructions (Addendum)
Activity:  You are encouraged to ambulate frequently (about every hour during waking hours) to help prevent blood clots from forming in your legs or lungs.    Diet: You should advance your diet as instructed by your physician.  It will be normal to have some bloating, nausea, and abdominal discomfort intermittently.  Prescriptions:  You will be provided a prescription for pain medication to take as needed.  If your pain is not severe enough to require the prescription pain medication, you may take extra strength Tylenol instead which will have less side effects.  You should also take a prescribed stool softener to avoid straining with bowel movements as the prescription pain medication may constipate you.  What to call us about: You should call the office [phone]) if you develop fever > 101 or develop persistent vomiting. Activity:  You are encouraged to ambulate frequently (about every hour during waking hours) to help prevent blood clots from forming in your legs or lungs.    You have a foley catheter in place. Remove foley catheter on Monday AM with provided syringe.

## 2022-04-24 NOTE — Transfer of Care (Signed)
Immediate Anesthesia Transfer of Care Note  Patient: Patrice Moates Putz  Procedure(s) Performed: Procedure(s): TRANSURETHRAL RESECTION OF BLADDER TUMOR (TURBT) (N/A)  Patient Location: PACU  Anesthesia Type:General  Level of Consciousness: Patient easily awoken, sedated, comfortable, cooperative, following commands, responds to stimulation.   Airway & Oxygen Therapy: Patient spontaneously breathing, ventilating well, oxygen via simple oxygen mask.  Post-op Assessment: Report given to PACU RN, vital signs reviewed and stable, moving all extremities.   Post vital signs: Reviewed and stable.  Complications: No apparent anesthesia complications  Last Vitals:  Vitals Value Taken Time  BP 144/52 04/24/22 1300  Temp    Pulse 73 04/24/22 1300  Resp 21 04/24/22 1300  SpO2 99 % 04/24/22 1300  Vitals shown include unvalidated device data.  Last Pain:  Vitals:   04/24/22 0918  TempSrc: Oral  PainSc:          Complications: No notable events documented.

## 2022-04-24 NOTE — H&P (Signed)
Office Visit Report     04/08/2022     CC/HPI: Trevor David is a 76 year old male seen in follow-up with a history of bladder cancer.   1. 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 Muscularis propria was not present. He did receive epirubicin. He did not receive BCG.  -Surveillance cystoscopy 04/08/2022 with 2 separate subcentimeter papillary lesions around the left ureteral orifice.  -He denies gross hematuria. He denies bone pain or expected weight loss. He has stable appetite.   #2. Prostate cancer screening: Last PSA in 02/2022 was 0.09. He denies a family history of prostate cancer. He denies taking 5 alpha reductase inhibitors.   #3. Bulbar urethral stricture: Cystoscopy 04/08/2022 with 18 French small segment bulbar urethral stricture easily passable with cystoscope. He does have a weaker flow stream. He does have some mild urgency. He states his symptoms are nonbothersome enough to warrant medication this time.   Patient currently denies fever, chills, sweats, nausea, vomiting, abdominal or flank pain, gross hematuria or dysuria.     ALLERGIES: None   MEDICATIONS: Cardura 8 mg tablet  Crestor 5 mg tablet  Lisinopril 5 mg tablet  Actos 45 mg tablet  Advil  Aspirin Ec 81 mg tablet, delayed release  Celexa 20 mg tablet  Chantix  Citalopram Hbr 10 mg tablet  Coq10  Furosemide 20 mg tablet  Ibuprofen 800 mg tablet  Indomethacin 50 mg capsule  Iron  Janumet 50 mg-1,000 mg tablet  Joint Health  Krill Oil  Nuvigil 50 mg tablet  Preser Vision  Trazodone Hcl 50 mg tablet     GU PSH: Bladder Instill AntiCA Agent - 2019 Cystoscopy - 03/13/2021, 2021, 2020, 2020, 2019, 2019, 2019 Cystoscopy TURBT <2 cm - 2019 Locm 300-'399Mg'$ /Ml Iodine,1Ml - 2019     NON-GU PSH: Lumbar Laminectomy, Right Rotator Cuff Surgery.., Left     GU PMH: Bladder tumor/neoplasm, Left - 03/13/2021, Left, He has a papillary bladder tumor on  the floor of the bladder on the left-hand side that looks like it is going to be superficial., - 2019 Encounter for Prostate Cancer screening (Stable) - 03/13/2021, (Stable), It has been a year since his last PSA. One will be obtained today and I will contact him with the results., - 2020, His prostate was noted to be benign. It has been 2 years since his last PSA so that will be checked as well today., - 2019 Urge incontinence - 03/13/2021, He has developed some urgency with urge incontinence. He is currently taking Cardura 8 mg and said that this really did not improve his symptoms. I have given him samples of Myrbetriq 25 mg and told him that we may need to increase the dosage to 50 mg. He will let me know how this has worked for him., - 2021 Urinary Urgency - 03/13/2021 History of bladder cancer, He had no evidence of recurrent transitional cell carcinoma of the bladder noted cystoscopically today. He had a solitary, superficial, low-grade lesion and therefore will return for surveillance cystoscopy again in 1 year. - 2021, (Stable), He was noted to have no evidence of recurrent transitional cell carcinoma. He had a low-grade, superficial, solitary lesion and therefore I will advance his surveillance to 6 months. If he is clear next time he can go to yearly cystoscopies thereafter., - 2020 (Stable), He had no evidence of recurrent transitional cell carcinoma of the bladder noted cystoscopically today. His tumor was low-grade and  superficial. I therefore told him that we could advance the duration between cystoscopic evaluations to 6 months., - 2020 (Stable), He had no evidence of recurrent transitional cell carcinoma the bladder noted cystoscopically today. He will return again in 3 months for routine surveillance cystoscopy., - 2019, - 2019, I will have him return in 3 months for surveillance cystoscopy., - 2019 BPH w/o LUTS (Stable), He does have some BPH noted cystoscopically I also note that his PSA was last  time it was checked in 3/19 he will be due for screening PSA again when he returns in 3 months. - 2019 Gross hematuria (Stable), His hematuria is clearly secondary to the bladder tumor identified. - 2019, He has experienced gross hematuria and has been and continues to be a cigarette smoker. I therefore have recommended a full hematuria evaluation with upper tract imaging and cystoscopy. I will check a creatinine today., - 2019    NON-GU PMH: Anxiety Depression Diabetes Type 2 Glaucoma Gout Sleep Apnea    FAMILY HISTORY: Myocardial Infarction - Mother, Father   SOCIAL HISTORY: Marital Status: Married Preferred Language: English; Ethnicity: Not Hispanic Or Latino; Race: White Current Smoking Status: Patient smokes. Has smoked since 02/05/1968. Smokes 1 pack per day.   Tobacco Use Assessment Completed: Used Tobacco in last 30 days? Drinks 4+ caffeinated drinks per day.    REVIEW OF SYSTEMS:    GU Review Male:   Patient denies frequent urination, hard to postpone urination, burning/ pain with urination, get up at night to urinate, leakage of urine, stream starts and stops, trouble starting your stream, have to strain to urinate , erection problems, and penile pain.  Gastrointestinal (Upper):   Patient denies nausea, vomiting, and indigestion/ heartburn.  Gastrointestinal (Lower):   Patient denies diarrhea and constipation.  Constitutional:   Patient denies fever, night sweats, weight loss, and fatigue.  Skin:   Patient denies skin rash/ lesion and itching.  Eyes:   Patient denies blurred vision and double vision.  Ears/ Nose/ Throat:   Patient denies sore throat and sinus problems.  Hematologic/Lymphatic:   Patient denies swollen glands and easy bruising.  Cardiovascular:   Patient denies leg swelling and chest pains.  Respiratory:   Patient denies cough and shortness of breath.  Endocrine:   Patient denies excessive thirst.  Musculoskeletal:   Patient denies back pain and joint pain.   Neurological:   Patient denies dizziness and headaches.  Psychologic:   Patient denies depression and anxiety.   VITAL SIGNS: None   MULTI-SYSTEM PHYSICAL EXAMINATION:    Constitutional: Well-nourished. No physical deformities. Normally developed. Good grooming.  Respiratory: No labored breathing, no use of accessory muscles.   Cardiovascular: Normal temperature, normal extremity pulses, no swelling, no varicosities.  Gastrointestinal: No mass, no tenderness, no rigidity, non obese abdomen.     Complexity of Data:  Source Of History:  Patient, Medical Record Summary  Lab Test Review:   PSA  Records Review:   Previous Doctor Records  Urine Test Review:   Urinalysis   01/25/19 01/18/19 03/04/18  PSA  Total PSA 0.15 ng/mL 0.18 ng/mL 0.16 ng/mL    PROCEDURES:         Flexible Cystoscopy - 52000  Risks, benefits, and some of the potential complications of the procedure were discussed at length with the patient including infection, bleeding, voiding discomfort, urinary retention, fever, chills, sepsis, and others. All questions were answered. Informed consent was obtained. Antibiotic prophylaxis was given. Sterile technique and intraurethral analgesia were used.  Meatus:  Normal size. Normal location. Normal condition.  Urethra:  62 French bulbar urethral stricture, easily passable with scope.  External Sphincter:  Normal.  Verumontanum:  Normal.  Prostate:  Borderline obstructing. Mild hyperplasia.  Bladder Neck:  Non-obstructing.  Ureteral Orifices:  Normal location. Normal size. Normal shape. Effluxed clear urine.  Bladder:  2 separate papillary appearing lesions each subcentimeter, 1 lateral to the left ureteral orifice and 1 posterior to the left ureteral orifice      The lower urinary tract was carefully examined. The procedure was well-tolerated and without complications. Antibiotic instructions were given. Instructions were given to call the office immediately for bloody urine,  difficulty urinating, urinary retention, painful or frequent urination, fever, chills, nausea, vomiting or other illness. The patient stated that he understood these instructions and would comply with them.         Urinalysis - 81003 Dipstick Dipstick Cont'd  Color: Yellow Bilirubin: Neg  Appearance: Clear Ketones: Neg  Specific Gravity: 1.015 Blood: Neg  pH: <=5.0 Protein: Neg  Glucose: Neg Urobilinogen: 0.2    Nitrites: Neg    Leukocyte Esterase: Neg    Notes:      ASSESSMENT:      ICD-10 Details  1 GU:   Bladder tumor/neoplasm - D41.4 Left  2   Encounter for Prostate Cancer screening - Z12.5    PLAN:           Document Letter(s):  Created for Patient: Clinical Summary         Notes:   #1. Low risk nonmuscle invasive bladder cancer:  -DX 04/15/2018 with TURBT demonstrating LG Ta UCB  -Cystoscopy today, 04/08/2022 with 2 separate subcentimeter papillary lesions around his left ureter.  -I recommend cystoscopy, TURBT, possible instillation of gemcitabine. Surgery letter sent.   #2. Prostate cancer screening: PSA in 02/2022 was remarkably low at 0.09. We did discuss that after age 7, we typically stop screening. He elects to continue screening. DRE today 40 g, no nodules.   #3. Bulbar reserve stricture: 70 French urethral stricture by cystoscope 04/2022. He elects surveillance.   CC: Azalia Bilis, MD   Urology Preoperative H&P   Chief Complaint: Bladder cancer  History of Present Illness: Trevor David is a 76 y.o. male with bladder cancer with recurrence here for TURBT. Denies fevers, chills, dysuria.    Past Medical History:  Diagnosis Date   Anemia    history of   Anxiety    Arthritis    Thumb and fingers   Aspiration pneumonia (HCC)    bacterial   Bladder tumor    Cancer ([place])    Bladder   Depression    Diabetes mellitus without complication ([place])    Hyperlipidemia    Hypertension    Left hydrocele    Small   Obesity    OSA on CPAP    No CPAP     Past Surgical History:  Procedure Laterality Date   COLONOSCOPY     EYE SURGERY Bilateral    cataract   LAMINECTOMY  1990's   ROTATOR CUFF REPAIR Left 2015   Dr. French Ana   TRANSURETHRAL RESECTION OF BLADDER TUMOR N/A 04/15/2018   Procedure: TRANSURETHRAL RESECTION OF BLADDER TUMOR (TURBT);  Surgeon: Kathie Rhodes, MD;  Location: [facility];  Service: Urology;  Laterality: N/A;    Allergies: No Known Allergies  History reviewed. No pertinent family history.  Social History:  reports that he quit smoking about 6 weeks ago. His smoking use  included cigarettes. He has a 20.00 pack-year smoking history. He has never used smokeless tobacco. He reports current alcohol use. He reports that he does not currently use drugs.  ROS: A complete review of systems was performed.  All systems are negative except for pertinent findings as noted.  Physical Exam:  Vital signs in last 24 hours: Temp:  [98.5 F (36.9 C)] 98.5 F (36.9 C) (05/19 0918) Pulse Rate:  [62] 62 (05/19 0918) Resp:  [18] 18 (05/19 0918) BP: (129)/(57) 129/57 (05/19 0918) SpO2:  [94 %] 94 % (05/19 0918) Weight:  [106.1 kg] 106.1 kg (05/19 0853) Constitutional:  Alert and oriented, No acute distress Cardiovascular: Regular rate and rhythm Respiratory: Normal respiratory effort, Lungs clear bilaterally GI: Abdomen is soft, nontender, nondistended, no abdominal masses GU: No CVA tenderness Lymphatic: No lymphadenopathy Neurologic: Grossly intact, no focal deficits Psychiatric: Normal mood and affect  Laboratory Data:  No results for input(s): WBC, HGB, HCT, PLT in the last 72 hours.  No results for input(s): NA, K, CL, GLUCOSE, BUN, CALCIUM, CREATININE in the last 72 hours.  Invalid input(s): CO3   Results for orders placed or performed during the hospital encounter of 04/24/22 (from the past 24 hour(s))  Glucose, capillary     Status: Abnormal   Collection Time: 04/24/22  9:21 AM  Result Value  Ref Range   Glucose-Capillary 145 (H) 70 - 99 mg/dL   No results found for this or any previous visit (from the past 240 hour(s)).  Renal Function: No results for input(s): CREATININE in the last 168 hours. Estimated Creatinine Clearance: 86.3 mL/min (by C-G formula based on SCr of 0.86 mg/dL).  Radiologic Imaging: No results found.  I independently reviewed the above imaging studies.  Assessment and Plan Trevor David is a 76 y.o. male with bladder cancer with recurrence here for TURBT.     Matt R. Pace Lamadrid MD 04/24/2022, 10:42 AM  [facility] Pager: [phone]

## 2022-04-25 ENCOUNTER — Encounter (HOSPITAL_COMMUNITY): Payer: Self-pay | Admitting: Urology

## 2022-04-27 LAB — SURGICAL PATHOLOGY

## 2022-04-30 DIAGNOSIS — R3915 Urgency of urination: Secondary | ICD-10-CM | POA: Diagnosis not present

## 2022-04-30 DIAGNOSIS — D414 Neoplasm of uncertain behavior of bladder: Secondary | ICD-10-CM | POA: Diagnosis not present

## 2022-04-30 DIAGNOSIS — Z125 Encounter for screening for malignant neoplasm of prostate: Secondary | ICD-10-CM | POA: Diagnosis not present

## 2022-05-29 DIAGNOSIS — C676 Malignant neoplasm of ureteric orifice: Secondary | ICD-10-CM | POA: Diagnosis not present

## 2022-05-29 DIAGNOSIS — Z5111 Encounter for antineoplastic chemotherapy: Secondary | ICD-10-CM | POA: Diagnosis not present

## 2022-06-05 DIAGNOSIS — C676 Malignant neoplasm of ureteric orifice: Secondary | ICD-10-CM | POA: Diagnosis not present

## 2022-06-05 DIAGNOSIS — Z5111 Encounter for antineoplastic chemotherapy: Secondary | ICD-10-CM | POA: Diagnosis not present

## 2022-06-12 DIAGNOSIS — Z5111 Encounter for antineoplastic chemotherapy: Secondary | ICD-10-CM | POA: Diagnosis not present

## 2022-06-12 DIAGNOSIS — C676 Malignant neoplasm of ureteric orifice: Secondary | ICD-10-CM | POA: Diagnosis not present

## 2022-06-19 DIAGNOSIS — Z8551 Personal history of malignant neoplasm of bladder: Secondary | ICD-10-CM | POA: Diagnosis not present

## 2022-06-19 DIAGNOSIS — Z5111 Encounter for antineoplastic chemotherapy: Secondary | ICD-10-CM | POA: Diagnosis not present

## 2022-06-26 DIAGNOSIS — Z5111 Encounter for antineoplastic chemotherapy: Secondary | ICD-10-CM | POA: Diagnosis not present

## 2022-06-26 DIAGNOSIS — Z8551 Personal history of malignant neoplasm of bladder: Secondary | ICD-10-CM | POA: Diagnosis not present

## 2022-07-03 DIAGNOSIS — D414 Neoplasm of uncertain behavior of bladder: Secondary | ICD-10-CM | POA: Diagnosis not present

## 2022-07-03 DIAGNOSIS — Z5111 Encounter for antineoplastic chemotherapy: Secondary | ICD-10-CM | POA: Diagnosis not present

## 2022-07-24 DIAGNOSIS — Z0184 Encounter for antibody response examination: Secondary | ICD-10-CM | POA: Diagnosis not present

## 2022-07-28 DIAGNOSIS — R82998 Other abnormal findings in urine: Secondary | ICD-10-CM | POA: Diagnosis not present

## 2022-07-28 DIAGNOSIS — D414 Neoplasm of uncertain behavior of bladder: Secondary | ICD-10-CM | POA: Diagnosis not present

## 2022-08-07 ENCOUNTER — Other Ambulatory Visit: Payer: Self-pay | Admitting: Urology

## 2022-08-18 ENCOUNTER — Encounter (HOSPITAL_BASED_OUTPATIENT_CLINIC_OR_DEPARTMENT_OTHER): Payer: Self-pay | Admitting: Urology

## 2022-08-18 NOTE — Progress Notes (Signed)
Spoke w/ via phone for pre-op interview--- Trevor David needs dos---- ISTAT              Lab results------EKG current dated 04/2022 in [place] test -----patient states asymptomatic no test needed Arrive at -------1000 NPO after MN NO Solid Food.  Clear liquids from MN until---0900 Med rec completed Medications to take morning of surgery ----- Cardura Diabetic medication ----- NONE AM of surgery Patient instructed no nail polish to be worn day of surgery Patient instructed to bring photo id and insurance card day of surgery Patient aware to have Driver (ride ) / caregiver  [name]  for 24 hours after surgery  Patient Special Instructions ----- Pre-Op special Istructions ----- Patient verbalized understanding of instructions that were given at this phone interview. Patient denies shortness of breath, chest pain, fever, cough at this phone interview.

## 2022-09-07 ENCOUNTER — Encounter (HOSPITAL_BASED_OUTPATIENT_CLINIC_OR_DEPARTMENT_OTHER)
Admission: RE | Disposition: A | Discharge status: Home / Self Care | Payer: Self-pay | Source: Ambulatory Visit | Attending: Urology

## 2022-09-07 ENCOUNTER — Encounter (HOSPITAL_BASED_OUTPATIENT_CLINIC_OR_DEPARTMENT_OTHER): Payer: Self-pay | Admitting: Urology

## 2022-09-07 ENCOUNTER — Ambulatory Visit (HOSPITAL_BASED_OUTPATIENT_CLINIC_OR_DEPARTMENT_OTHER)
Admission: RE | Admit: 2022-09-07 | Discharge: 2022-09-07 | Disposition: A | Discharge status: Home / Self Care | Payer: Medicare PPO | Source: Ambulatory Visit | Attending: Urology | Admitting: Urology

## 2022-09-07 ENCOUNTER — Ambulatory Visit (HOSPITAL_BASED_OUTPATIENT_CLINIC_OR_DEPARTMENT_OTHER): Payer: Medicare PPO | Admitting: Certified Registered Nurse Anesthetist

## 2022-09-07 DIAGNOSIS — C679 Malignant neoplasm of bladder, unspecified: Secondary | ICD-10-CM | POA: Diagnosis not present

## 2022-09-07 DIAGNOSIS — M1990 Unspecified osteoarthritis, unspecified site: Secondary | ICD-10-CM | POA: Insufficient documentation

## 2022-09-07 DIAGNOSIS — E669 Obesity, unspecified: Secondary | ICD-10-CM | POA: Diagnosis not present

## 2022-09-07 DIAGNOSIS — Z6838 Body mass index (BMI) 38.0-38.9, adult: Secondary | ICD-10-CM | POA: Insufficient documentation

## 2022-09-07 DIAGNOSIS — H42 Glaucoma in diseases classified elsewhere: Secondary | ICD-10-CM | POA: Diagnosis not present

## 2022-09-07 DIAGNOSIS — C672 Malignant neoplasm of lateral wall of bladder: Secondary | ICD-10-CM | POA: Insufficient documentation

## 2022-09-07 DIAGNOSIS — E119 Type 2 diabetes mellitus without complications: Secondary | ICD-10-CM | POA: Insufficient documentation

## 2022-09-07 DIAGNOSIS — E1139 Type 2 diabetes mellitus with other diabetic ophthalmic complication: Secondary | ICD-10-CM | POA: Diagnosis not present

## 2022-09-07 DIAGNOSIS — G4730 Sleep apnea, unspecified: Secondary | ICD-10-CM | POA: Diagnosis not present

## 2022-09-07 DIAGNOSIS — I10 Essential (primary) hypertension: Secondary | ICD-10-CM | POA: Insufficient documentation

## 2022-09-07 DIAGNOSIS — F17200 Nicotine dependence, unspecified, uncomplicated: Secondary | ICD-10-CM | POA: Diagnosis not present

## 2022-09-07 DIAGNOSIS — N3289 Other specified disorders of bladder: Secondary | ICD-10-CM | POA: Diagnosis not present

## 2022-09-07 DIAGNOSIS — F418 Other specified anxiety disorders: Secondary | ICD-10-CM | POA: Diagnosis not present

## 2022-09-07 DIAGNOSIS — F319 Bipolar disorder, unspecified: Secondary | ICD-10-CM | POA: Insufficient documentation

## 2022-09-07 DIAGNOSIS — F419 Anxiety disorder, unspecified: Secondary | ICD-10-CM | POA: Insufficient documentation

## 2022-09-07 HISTORY — PX: TRANSURETHRAL RESECTION OF BLADDER TUMOR: SHX2575

## 2022-09-07 LAB — POCT I-STAT, CHEM 8
BUN: 17 mg/dL (ref 8–23)
Calcium, Ion: 1.21 mmol/L (ref 1.15–1.40)
Chloride: 103 mmol/L (ref 98–111)
Creatinine, Ser: 0.9 mg/dL (ref 0.61–1.24)
Glucose, Bld: 127 mg/dL — ABNORMAL HIGH (ref 70–99)
HCT: 41 % (ref 39.0–52.0)
Hemoglobin: 13.9 g/dL (ref 13.0–17.0)
Potassium: 4.3 mmol/L (ref 3.5–5.1)
Sodium: 138 mmol/L (ref 135–145)
TCO2: 22 mmol/L (ref 22–32)

## 2022-09-07 LAB — GLUCOSE, CAPILLARY: Glucose-Capillary: 123 mg/dL — ABNORMAL HIGH (ref 70–99)

## 2022-09-07 SURGERY — TURBT (TRANSURETHRAL RESECTION OF BLADDER TUMOR)
Anesthesia: General

## 2022-09-07 MED ORDER — LACTATED RINGERS IV SOLN: INTRAVENOUS | Status: DC

## 2022-09-07 MED ORDER — ONDANSETRON HCL 4 MG/2ML IJ SOLN: 4.0000 mg | Freq: Once | INTRAMUSCULAR | Status: DC | PRN

## 2022-09-07 MED ORDER — LIDOCAINE 2% (20 MG/ML) 5 ML SYRINGE
INTRAMUSCULAR | Status: DC | PRN
  Administered 2022-09-07: 100 mg via INTRAVENOUS

## 2022-09-07 MED ORDER — CEFAZOLIN SODIUM-DEXTROSE 2-4 GM/100ML-% IV SOLN
INTRAVENOUS | Status: AC
  Filled 2022-09-07: qty 100

## 2022-09-07 MED ORDER — SODIUM CHLORIDE 0.9 % IR SOLN
Status: DC | PRN
  Administered 2022-09-07: 3000 mL

## 2022-09-07 MED ORDER — ACETAMINOPHEN 160 MG/5ML PO SOLN: 325.0000 mg | ORAL | Status: DC | PRN

## 2022-09-07 MED ORDER — ACETAMINOPHEN 10 MG/ML IV SOLN: 1000.0000 mg | Freq: Once | INTRAVENOUS | Status: DC | PRN

## 2022-09-07 MED ORDER — OXYCODONE HCL 5 MG PO TABS: 5.0000 mg | ORAL_TABLET | Freq: Once | ORAL | Status: DC | PRN

## 2022-09-07 MED ORDER — CEFAZOLIN SODIUM-DEXTROSE 2-4 GM/100ML-% IV SOLN
2.0000 g | Freq: Once | INTRAVENOUS | Status: AC
  Administered 2022-09-07: 2 g via INTRAVENOUS

## 2022-09-07 MED ORDER — DOCUSATE SODIUM 100 MG PO CAPS: 100.0000 mg | ORAL_CAPSULE | Freq: Every day | ORAL | 0 refills | Status: DC | PRN

## 2022-09-07 MED ORDER — FENTANYL CITRATE (PF) 100 MCG/2ML IJ SOLN: 25.0000 ug | INTRAMUSCULAR | Status: DC | PRN

## 2022-09-07 MED ORDER — IOHEXOL 300 MG/ML  SOLN
INTRAMUSCULAR | Status: DC | PRN
  Administered 2022-09-07: 15 mL

## 2022-09-07 MED ORDER — FENTANYL CITRATE (PF) 250 MCG/5ML IJ SOLN
INTRAMUSCULAR | Status: DC | PRN
  Administered 2022-09-07: 50 ug via INTRAVENOUS

## 2022-09-07 MED ORDER — OXYCODONE HCL 5 MG/5ML PO SOLN: 5.0000 mg | Freq: Once | ORAL | Status: DC | PRN

## 2022-09-07 MED ORDER — DEXAMETHASONE SODIUM PHOSPHATE 10 MG/ML IJ SOLN
INTRAMUSCULAR | Status: DC | PRN
  Administered 2022-09-07: 4 mg via INTRAVENOUS

## 2022-09-07 MED ORDER — PROPOFOL 10 MG/ML IV BOLUS
INTRAVENOUS | Status: DC | PRN
  Administered 2022-09-07: 200 mg via INTRAVENOUS

## 2022-09-07 MED ORDER — ACETAMINOPHEN 325 MG PO TABS: 325.0000 mg | ORAL_TABLET | ORAL | Status: DC | PRN

## 2022-09-07 MED ORDER — FENTANYL CITRATE (PF) 100 MCG/2ML IJ SOLN
INTRAMUSCULAR | Status: AC
  Filled 2022-09-07: qty 2

## 2022-09-07 MED ORDER — ONDANSETRON HCL 4 MG/2ML IJ SOLN
INTRAMUSCULAR | Status: DC | PRN
  Administered 2022-09-07: 4 mg via INTRAVENOUS

## 2022-09-07 MED ORDER — PROPOFOL 10 MG/ML IV BOLUS
INTRAVENOUS | Status: AC
  Filled 2022-09-07: qty 20

## 2022-09-07 MED ORDER — OXYCODONE-ACETAMINOPHEN 5-325 MG PO TABS: 1.0000 | ORAL_TABLET | ORAL | 0 refills | Status: DC | PRN

## 2022-09-07 SURGICAL SUPPLY — 26 items
BAG DRAIN URO-CYSTO SKYTR STRL (DRAIN) ×1 IMPLANT
BAG DRN RND TRDRP ANRFLXCHMBR (UROLOGICAL SUPPLIES)
BAG DRN UROCATH (DRAIN) ×1
BAG URINE DRAIN 2000ML AR STRL (UROLOGICAL SUPPLIES) IMPLANT
BAG URINE LEG 500ML (DRAIN) IMPLANT
CATH FOLEY 2WAY SLVR  5CC 22FR (CATHETERS)
CATH FOLEY 2WAY SLVR 30CC 20FR (CATHETERS) IMPLANT
CATH FOLEY 2WAY SLVR 5CC 22FR (CATHETERS) IMPLANT
CATH SET URETHRAL DILATOR (CATHETERS) IMPLANT
CATH URETL OPEN 5X70 (CATHETERS) IMPLANT
CLOTH BEACON ORANGE TIMEOUT ST (SAFETY) ×1 IMPLANT
ELECT REM PT RETURN 9FT ADLT (ELECTROSURGICAL)
ELECTRODE REM PT RTRN 9FT ADLT (ELECTROSURGICAL) ×1 IMPLANT
EVACUATOR MICROVAS BLADDER (UROLOGICAL SUPPLIES) IMPLANT
GLOVE BIO SURGEON STRL SZ7 (GLOVE) IMPLANT
GLOVE ECLIPSE 7.5 STRL STRAW (GLOVE) IMPLANT
GOWN STRL REUS W/TWL LRG LVL3 (GOWN DISPOSABLE) ×1 IMPLANT
GUIDEWIRE STR DUAL SENSOR (WIRE) IMPLANT
GUIDEWIRE ZIPWRE .038 STRAIGHT (WIRE) IMPLANT
KIT TURNOVER CYSTO (KITS) ×1 IMPLANT
LOOP CUT BIPOLAR 24F LRG (ELECTROSURGICAL) IMPLANT
MANIFOLD NEPTUNE II (INSTRUMENTS) IMPLANT
PACK CYSTO (CUSTOM PROCEDURE TRAY) ×1 IMPLANT
SYR TOOMEY IRRIG 70ML (MISCELLANEOUS)
SYRINGE TOOMEY IRRIG 70ML (MISCELLANEOUS) IMPLANT
TUBE CONNECTING 12X1/4 (SUCTIONS) IMPLANT

## 2022-09-07 NOTE — Anesthesia Preprocedure Evaluation (Signed)
Anesthesia Evaluation  Patient identified by MRN, date of birth, ID band Patient awake    Reviewed: Allergy & Precautions, NPO status , Patient's Chart, lab work & pertinent test results  Airway Mallampati: II       Dental no notable dental hx. (+) Teeth Intact   Pulmonary sleep apnea , former smoker,    Pulmonary exam normal        Cardiovascular hypertension, Pt. on medications Normal cardiovascular exam     Neuro/Psych PSYCHIATRIC DISORDERS Anxiety Depression Bipolar Disorder    GI/Hepatic negative GI ROS, Neg liver ROS,   Endo/Other  diabetes, Type 2  Renal/GU negative Renal ROS     Musculoskeletal  (+) Arthritis , Osteoarthritis,    Abdominal (+) + obese,   Peds  Hematology   Anesthesia Other Findings   Reproductive/Obstetrics                             Anesthesia Physical  Anesthesia Plan  ASA: III  Anesthesia Plan: General   Post-op Pain Management: Dilaudid IV   Induction: Intravenous  PONV Risk Score and Plan: 4 or greater and Ondansetron, Dexamethasone, Midazolam and Treatment may vary due to age or medical condition  Airway Management Planned: LMA  Additional Equipment: None  Intra-op Plan:   Post-operative Plan:   Informed Consent: I have reviewed the patients History and Physical, chart, labs and discussed the procedure including the risks, benefits and alternatives for the proposed anesthesia with the patient or authorized representative who has indicated his/her understanding and acceptance.     Dental advisory given  Plan Discussed with: CRNA  Anesthesia Plan Comments:         Anesthesia Quick Evaluation

## 2022-09-07 NOTE — Anesthesia Postprocedure Evaluation (Signed)
Anesthesia Post Note  Patient: Trevor David  Procedure(s) Performed: TRANSURETHRAL RESECTION OF BLADDER TUMOR (TURBT/ CYSTOSCOPY/ BILATERAL RETROGRADE     Patient location during evaluation: Phase II Anesthesia Type: General Level of consciousness: awake Pain management: pain level controlled Vital Signs Assessment: post-procedure vital signs reviewed and stable Respiratory status: spontaneous breathing Cardiovascular status: stable Postop Assessment: no apparent nausea or vomiting Anesthetic complications: no   No notable events documented.  Last Vitals:  Vitals:   09/07/22 1300 09/07/22 1315  BP: (!) 125/58 (!) 126/57  Pulse: (!) 53 (!) 56  Resp: 14 12  Temp:  36.4 C  SpO2: 95% 96%    Last Pain:  Vitals:   09/07/22 1315  TempSrc:   PainSc: 0-No pain                 Huston Foley

## 2022-09-07 NOTE — Discharge Instructions (Addendum)
Activity:  You are encouraged to ambulate frequently (about every hour during waking hours) to help prevent blood clots from forming in your legs or lungs.    Diet: You should advance your diet as instructed by your physician.  It will be normal to have some bloating, nausea, and abdominal discomfort intermittently.  Prescriptions:  You will be provided a prescription for pain medication to take as needed.  If your pain is not severe enough to require the prescription pain medication, you may take extra strength Tylenol instead which will have less side effects.  You should also take a prescribed stool softener to avoid straining with bowel movements as the prescription pain medication may constipate you.  What to call us about: You should call the office ([phone]) if you develop fever > 101 or develop persistent vomiting. Activity:  You are encouraged to ambulate frequently (about every hour during waking hours) to help prevent blood clots from forming in your legs or lungs.          Post Anesthesia Home Care Instructions  Activity: Get plenty of rest for the remainder of the day. A responsible individual must stay with you for 24 hours following the procedure.  For the next 24 hours, DO NOT: -Drive a car -Operate machinery -Drink alcoholic beverages -Take any medication unless instructed by your physician -Make any legal decisions or sign important papers.  Meals: Start with liquid foods such as gelatin or soup. Progress to regular foods as tolerated. Avoid greasy, spicy, heavy foods. If nausea and/or vomiting occur, drink only clear liquids until the nausea and/or vomiting subsides. Call your physician if vomiting continues.  Special Instructions/Symptoms: Your throat may feel dry or sore from the anesthesia or the breathing tube placed in your throat during surgery. If this causes discomfort, gargle with warm salt water. The discomfort should disappear within 24 hours.       

## 2022-09-07 NOTE — Transfer of Care (Signed)
Immediate Anesthesia Transfer of Care Note  Patient: Trevor David  Procedure(s) Performed: TRANSURETHRAL RESECTION OF BLADDER TUMOR (TURBT/ CYSTOSCOPY/ BILATERAL RETROGRADE  Patient Location: PACU  Anesthesia Type:General  Level of Consciousness: awake, alert  and oriented  Airway & Oxygen Therapy: Patient Spontanous Breathing  Post-op Assessment: Report given to RN and Post -op Vital signs reviewed and stable  Post vital signs: Reviewed and stable  Last Vitals:  Vitals Value Taken Time  BP    Temp    Pulse 63 09/07/22 1247  Resp 12 09/07/22 1247  SpO2 95 % 09/07/22 1247  Vitals shown include unvalidated device data.  Last Pain:  Vitals:   09/07/22 1010  TempSrc: Oral  PainSc: 0-No pain      Patients Stated Pain Goal: 3 (38/38/18 4037)  Complications: No notable events documented.

## 2022-09-07 NOTE — Op Note (Signed)
Operative Note  Preoperative diagnosis:  1.  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  Small TURBT 2.  Bilateral retrograde pyelogram  Surgeon: Rexene Alberts, MD  Assistants:  None  Anesthesia:  General  Complications:  None  EBL: Minimal  Specimens: 1.  ID Type Source Tests Collected by Time Destination  1 : left lateral wall bladder lesion GU Bladder Biopsy SURGICAL PATHOLOGY Janith Lima, MD 09/07/2022 1232    Drains/Catheters: 1.  None  Intraoperative findings:   Right retrograde pyelogram with no filling defect, no hydronephrosis, no extravasation of urine.  Contrast drained promptly. Left retrograde pyelogram with slight J hooking of the left distal ureter however no significant hydronephrosis, no filling defect, no extravasation of urine.  Contrast drained promptly. Approximately 2 x 2 centimeter area of erythema superior lateral to the left ureteral orifice.  This area was resected deeply in its entirety and fulgurated with excellent hemostasis.  Number of tumors: one  Size of largest tumor:        2 cm  Characteristics of tumors:   Flat  Recurrent yes  Suspicious for Carcinoma in situ:    Yes  Clinical tumor stage:          cTa     Bimanual exam under anesthesia:        Performed, no evidence of three-dimensional mass  Visually complete resection:                 Yes  Visualization of detrusor muscle in resection base:        Yes  Visual evaluation for perforation:             Performed, no evidence of perforation.   Indication:  Trevor David is a 76 y.o. male with history of intermediate risk nonmuscle invasive urothelial carcinoma the bladder who had areas of persistent erythema superior and lateral to the left ureteral orifice.  He is being brought back to the operating today for bladder  resection.  All the risks, benefits were discussed with the patient to include but not limited to infection, pain, bleeding, damage to adjacent structures, need for further operations, adverse reaction to anesthesia and death.  Patient understands these risks and agrees to proceed with the operation as planned.    Description of procedure: After informed consent was obtained from the patient, the patient was taken to the operating room. General anesthesia was administered. The patient was placed in dorsal lithotomy position and prepped and draped in usual sterile fashion. Sequential compression devices were applied to lower extremities at the beginning of the case for DVT prophylaxis. Antibiotics were infused prior to surgery start time. A surgical time-out was performed to properly identify the patient, the surgery to be performed, and the surgical site.     We then passed the 21-French rigid cystoscope down the urethra and into the bladder under direct vision without any difficulty. The anterior urethra had a few degrees of wide caliber urethral narrowings.  This was easily navigated with a 21 [place] resectoscope. The prostate was mildly obstructing. The bladder was inspected with 30 and 70 degree lenses. Once in the bladder, systematic evaluation of bladder revealed area of erythema just inferior and lateral to the left ureteral orifice. The ureteral orfices were in orthotopic position and not involved.  We then intubated the left ureteral orifice with a 0.038  sensor wire and a 5 French opening catheter and performed distal left retrograde pyelogram demonstrated slight hooking of the distal left ureter however no significant hydronephrosis, no filling defect, no extravasation of urine.  The contrast drained promptly.  In similar fashion, we performed a right retrograde pyelogram demonstrating no filling defect, hydronephrosis, extravasation of urine.   We then removed the cystoscope and then passed down  the 26 French resectoscope sheath down the urethra into the bladder under direct vision with the visual obturator.  The area of erythema overlying the left lateral wall was resected deeply down to the level of the muscle.  The TUR bladder tumor chips were retrieved from the bladder and each region of resection was passed off the field as a separate specimen.  Hemostasis was achieved using electrocautery. The patient tolerated the procedure well with no complication and was awoken from anesthesia and taken to recovery in stable condition.    Plan: Discharge home.  He will follow with me in 1 week to review pathology.  Matt R. [facility]  Pager: [phone]

## 2022-09-07 NOTE — Anesthesia Procedure Notes (Signed)
Procedure Name: LMA Insertion Date/Time: 09/07/2022 12:12 PM  Performed by: Clearnce Sorrel, CRNAPre-anesthesia Checklist: Patient identified, Emergency Drugs available, Suction available and Patient being monitored Patient Re-evaluated:Patient Re-evaluated prior to induction Oxygen Delivery Method: Circle System Utilized Preoxygenation: Pre-oxygenation with 100% oxygen Induction Type: IV induction Ventilation: Mask ventilation without difficulty LMA: LMA inserted LMA Size: 4.0 Number of attempts: 1 Airway Equipment and Method: Bite block Placement Confirmation: positive ETCO2 Tube secured with: Tape Dental Injury: Teeth and Oropharynx as per pre-operative assessment

## 2022-09-07 NOTE — H&P (Signed)
Office Visit Report     07/28/2022   --------------------------------------------------------------------------------     --------------------------------------------------------------------------------   CC/HPI: Trevor David is a 76 year old male seen in follow-up with a history of bladder cancer.   1. 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 Muscularis propria was not present. He did receive epirubicin. He did not receive BCG.  -Surveillance cystoscopy 04/08/2022 with 2 separate subcentimeter papillary lesions around the left ureteral orifice.  -TURBT 04/24/2022 with HG Ta UCB.  -S/p BCG induction completed 06/2022   -He denies gross hematuria. He denies bone pain or expected weight loss. He has stable appetite.   #2. Prostate cancer screening: Last PSA in 02/2022 was 0.09. He denies a family history of prostate cancer. He denies taking 5 alpha reductase inhibitors.   #3. Bulbar urethral stricture: Cystoscopy 04/08/2022 with 18 French small segment bulbar urethral stricture easily passable with cystoscope. He does have a weaker flow stream. He does have some mild urgency. He states his symptoms are nonbothersome enough to warrant medication this time.   Patient currently denies fever, chills, sweats, nausea, vomiting, abdominal or flank pain, gross hematuria or dysuria.     ALLERGIES: None   MEDICATIONS: Cardura 8 mg tablet  Crestor 5 mg tablet  Lisinopril 5 mg tablet  Actos 45 mg tablet  Advil  Aspirin Ec 81 mg tablet, delayed release  Celexa 20 mg tablet  Chantix  Citalopram Hbr 10 mg tablet  Coq10  Furosemide 20 mg tablet  Ibuprofen 800 mg tablet  Indomethacin 50 mg capsule  Iron  Janumet 50 mg-1,000 mg tablet  Joint Health  Krill Oil  Nuvigil 50 mg tablet  Preser Vision  Trazodone Hcl 50 mg tablet     GU PSH: Bladder Instill AntiCA Agent - 07/03/2022, 06/26/2022,  06/19/2022, 06/12/2022, 06/05/2022, 05/29/2022, 2019 Cystoscopy - 04/08/2022, 03/13/2021, 2021, 2020, 2020, 2019, 2019, 2019 Cystoscopy TURBT <2 cm - 04/24/2022, 2019 Locm 300-'399Mg'$ /Ml Iodine,1Ml - 2019     NON-GU PSH: Lumbar Laminectomy, Right Rotator Cuff Surgery.., Left     GU PMH: Bladder tumor/neoplasm, Left - 07/03/2022, Left, - 06/19/2022, Left, - 06/12/2022, Left, - 06/05/2022, Left, - 04/30/2022, Left, - 04/08/2022, Left, - 03/13/2021, Left, He has a papillary bladder tumor on the floor of the bladder on the left-hand side that looks like it is going to be superficial., - 2019 History of bladder cancer - 06/26/2022, - 06/19/2022, - 05/29/2022, He had no evidence of recurrent transitional cell carcinoma of the bladder noted cystoscopically today. He had a solitary, superficial, low-grade lesion and therefore will return for surveillance cystoscopy again in 1 year., - 2021 (Stable), He was noted to have no evidence of recurrent transitional cell carcinoma. He had a low-grade, superficial, solitary lesion and therefore I will advance his surveillance to 6 months. If he is clear next time he can go to yearly cystoscopies thereafter., - 2020 (Stable), He had no evidence of recurrent transitional cell carcinoma of the bladder noted cystoscopically today. His tumor was low-grade and superficial. I therefore told him that we could advance the duration between cystoscopic evaluations to 6 months., - 2020 (Stable), He had no evidence of recurrent transitional cell carcinoma the bladder noted cystoscopically today. He will return again in 3 months for routine surveillance cystoscopy., - 2019, - 2019, I will have him return in 3 months for surveillance cystoscopy., - 2019 Encounter for Prostate Cancer screening - 04/30/2022, - 04/08/2022 (Stable), - 03/13/2021 (  Stable), It has been a year since his last PSA. One will be obtained today and I will contact him with the results., - 2020, His prostate was noted to be benign. It has been  2 years since his last PSA so that will be checked as well today., - 2019 Urinary Urgency - 04/30/2022, - 03/13/2021 Urge incontinence - 03/13/2021, He has developed some urgency with urge incontinence. He is currently taking Cardura 8 mg and said that this really did not improve his symptoms. I have given him samples of Myrbetriq 25 mg and told him that we may need to increase the dosage to 50 mg. He will let me know how this has worked for him., - 2021 BPH w/o LUTS (Stable), He does have some BPH noted cystoscopically I also note that his PSA was last time it was checked in 3/19 he will be due for screening PSA again when he returns in 3 months. - 2019 Gross hematuria (Stable), His hematuria is clearly secondary to the bladder tumor identified. - 2019, He has experienced gross hematuria and has been and continues to be a cigarette smoker. I therefore have recommended a full hematuria evaluation with upper tract imaging and cystoscopy. I will check a creatinine today., - 2019    NON-GU PMH: Anxiety Depression Diabetes Type 2 Glaucoma Gout Sleep Apnea    FAMILY HISTORY: Myocardial Infarction - Mother, Father   SOCIAL HISTORY: Marital Status: Married Preferred Language: English; Ethnicity: Not Hispanic Or Latino; Race: White Current Smoking Status: Patient smokes. Has smoked since 02/05/1968. Smokes 1 pack per day.   Tobacco Use Assessment Completed: Used Tobacco in last 30 days? Drinks 4+ caffeinated drinks per day.    REVIEW OF SYSTEMS:    GU Review Male:   Patient denies frequent urination, hard to postpone urination, burning/ pain with urination, get up at night to urinate, leakage of urine, stream starts and stops, trouble starting your stream, have to strain to urinate , erection problems, and penile pain.  Gastrointestinal (Upper):   Patient denies nausea, vomiting, and indigestion/ heartburn.  Gastrointestinal (Lower):   Patient denies constipation and diarrhea.  Constitutional:    Patient denies fever, night sweats, weight loss, and fatigue.  Skin:   Patient denies skin rash/ lesion and itching.  Eyes:   Patient denies blurred vision and double vision.  Ears/ Nose/ Throat:   Patient denies sore throat and sinus problems.  Hematologic/Lymphatic:   Patient denies swollen glands and easy bruising.  Cardiovascular:   Patient denies leg swelling and chest pains.  Respiratory:   Patient denies cough and shortness of breath.  Endocrine:   Patient denies excessive thirst.  Musculoskeletal:   Patient denies back pain and joint pain.  Neurological:   Patient denies headaches and dizziness.  Psychologic:   Patient denies depression and anxiety.   VITAL SIGNS: None   MULTI-SYSTEM PHYSICAL EXAMINATION:    Constitutional: Well-nourished. No physical deformities. Normally developed. Good grooming.  Respiratory: No labored breathing, no use of accessory muscles.   Cardiovascular: Normal temperature, normal extremity pulses, no swelling, no varicosities.  Gastrointestinal: No mass, no tenderness, no rigidity, non obese abdomen.     Complexity of Data:  Source Of History:  Patient, Medical Record Summary  Lab Test Review:   PSA  Records Review:   Previous Doctor Records  Urine Test Review:   Urinalysis   01/25/19 01/18/19 03/04/18  PSA  Total PSA 0.15 ng/mL 0.18 ng/mL 0.16 ng/mL    PROCEDURES:  Flexible Cystoscopy - 52000  Risks, benefits, and some of the potential complications of the procedure were discussed at length with the patient including infection, bleeding, voiding discomfort, urinary retention, fever, chills, sepsis, and others. All questions were answered. Informed consent was obtained. Antibiotic prophylaxis was given. Sterile technique and intraurethral analgesia were used.  Meatus:  Normal size. Normal location. Normal condition.  Urethra:  No strictures.  External Sphincter:  Normal.  Verumontanum:  Normal.  Prostate:  Non-obstructing. No  hyperplasia.  Bladder Neck:  Non-obstructing.  Ureteral Orifices:  Right appears normal. Difficult to visualize left ureteral orifice giving surrounding edema and erythema.  Bladder:  2 x 1 cm area of increased erythema around his left ureteral orifice, suspicious for CIS. No papillary lesions present. Left ureteral orifice difficult to visualize.      The lower urinary tract was carefully examined. The procedure was well-tolerated and without complications. Antibiotic instructions were given. Instructions were given to call the office immediately for bloody urine, difficulty urinating, urinary retention, painful or frequent urination, fever, chills, nausea, vomiting or other illness. The patient stated that he understood these instructions and would comply with them.         Urinalysis Dipstick Dipstick Cont'd  Color: Yellow Bilirubin: Neg mg/dL  Appearance: Clear Ketones: Neg mg/dL  Specific Gravity: 1.025 Blood: Neg ery/uL  pH: <=5.0 Protein: Neg mg/dL  Glucose: Neg mg/dL Urobilinogen: 0.2 mg/dL    Nitrites: Neg    Leukocyte Esterase: Neg leu/uL    ASSESSMENT:      ICD-10 Details  1 GU:   Bladder tumor/neoplasm - D41.4 Left  2   Encounter for Prostate Cancer screening - Z12.5    PLAN:           Orders Labs Urine Cytology          Document Letter(s):  Created for Patient: Clinical Summary         Notes:   #1. 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  -Cystoscopy today with areas of erythema around his left ureteral orifice. I recommend cystoscopy, TURBT, bilateral retrograde pyelogram. Surgery letter sent.   #2. Prostate cancer screening: PSA in 02/2022 was remarkably low at 0.09. We did discuss that after age 38, we typically stop screening. He elects to continue screening. DRE 40 g, no nodules.   #3. Bulbar reserve stricture: 66 French urethral stricture  by cystoscope 04/2022. He elects surveillance.   CC: Azalia Bilis, MD   Urology Preoperative H&P   Chief Complaint: Bladder cancer  History of Present Illness: PAYTEN BEAUMIER is a 76 y.o. male with bladder cancer here for TURBT. Denies fevers, chills, dysuria.    Past Medical History:  Diagnosis Date   Anemia    history of   Anxiety    Arthritis    Thumb and fingers   Aspiration pneumonia (HCC)    bacterial   Bladder tumor    Cancer ([place])    Bladder   Depression    Diabetes mellitus without complication ([place])    Hyperlipidemia    Hypertension    Left hydrocele    Small   Obesity    OSA on CPAP    No CPAP    Past Surgical History:  Procedure Laterality Date   COLONOSCOPY     EYE SURGERY Bilateral    cataract   LAMINECTOMY  1990's   ROTATOR CUFF REPAIR  Left 2015   Dr. French Ana   TRANSURETHRAL RESECTION OF BLADDER TUMOR N/A 04/15/2018   Procedure: TRANSURETHRAL RESECTION OF BLADDER TUMOR (TURBT);  Surgeon: Kathie Rhodes, MD;  Location: [facility];  Service: Urology;  Laterality: N/A;   TRANSURETHRAL RESECTION OF BLADDER TUMOR N/A 04/24/2022   Procedure: TRANSURETHRAL RESECTION OF BLADDER TUMOR (TURBT);  Surgeon: Janith Lima, MD;  Location: [facility];  Service: Urology;  Laterality: N/A;    Allergies: No Known Allergies  History reviewed. No pertinent family history.  Social History:  reports that he quit smoking about 6 months ago. His smoking use included cigarettes. He has a 20.00 pack-year smoking history. He has never used smokeless tobacco. He reports current alcohol use. He reports that he does not currently use drugs.  ROS: A complete review of systems was performed.  All systems are negative except for pertinent findings as noted.  Physical Exam:  Vital signs in last 24 hours: Temp:  [97.4 F (36.3 C)] 97.4 F (36.3 C) (10/02 1010) Pulse Rate:  [69] 69 (10/02 1010) Resp:  [17] 17 (10/02 1010) BP: (154)/(62) 154/62 (10/02 1010) SpO2:   [97 %] 97 % (10/02 1010) Weight:  [115.3 kg] 115.3 kg (10/02 1010) Constitutional:  Alert and oriented, No acute distress Cardiovascular: Regular rate and rhythm Respiratory: Normal respiratory effort, Lungs clear bilaterally GI: Abdomen is soft, nontender, nondistended, no abdominal masses GU: No CVA tenderness Lymphatic: No lymphadenopathy Neurologic: Grossly intact, no focal deficits Psychiatric: Normal mood and affect  Laboratory Data:  No results for input(s): "WBC", "HGB", "HCT", "PLT" in the last 72 hours.  No results for input(s): "NA", "K", "CL", "GLUCOSE", "BUN", "CALCIUM", "CREATININE" in the last 72 hours.  Invalid input(s): "CO3"   No results found for this or any previous visit (from the past 24 hour(s)). No results found for this or any previous visit (from the past 240 hour(s)).  Renal Function: No results for input(s): "CREATININE" in the last 168 hours. CrCl cannot be calculated (Patient's most recent lab result is older than the maximum 21 days allowed.).  Radiologic Imaging: No results found.  I independently reviewed the above imaging studies.  Assessment and Plan Shabazz Mckey Manfre is a 76 y.o. male with bladder cancer here for TURBT.   Risks and benefits of Transurethral Resection of Bladder Tumor were reviewed in detail including infection, bleeding, blood transfusion, injury to bladder/urethra/surrounding structures, bladder perforation, obstructive and irritative voiding symptoms, and global anesthesia risks including but not limited to CVA, MI, DVT, PE, pneumonia, and death. Patient expressed understanding and desire to proceed.   Matt R. Amreen Raczkowski MD 09/07/2022, 10:19 AM  [facility] Pager: [phone]): [phone]

## 2022-09-08 LAB — SURGICAL PATHOLOGY

## 2022-09-09 ENCOUNTER — Encounter (HOSPITAL_BASED_OUTPATIENT_CLINIC_OR_DEPARTMENT_OTHER): Payer: Self-pay | Admitting: Urology

## 2022-09-14 DIAGNOSIS — Z125 Encounter for screening for malignant neoplasm of prostate: Secondary | ICD-10-CM | POA: Diagnosis not present

## 2022-09-14 DIAGNOSIS — D414 Neoplasm of uncertain behavior of bladder: Secondary | ICD-10-CM | POA: Diagnosis not present

## 2022-10-09 DIAGNOSIS — Z20828 Contact with and (suspected) exposure to other viral communicable diseases: Secondary | ICD-10-CM | POA: Diagnosis not present

## 2022-12-04 DIAGNOSIS — I10 Essential (primary) hypertension: Secondary | ICD-10-CM | POA: Diagnosis not present

## 2022-12-04 DIAGNOSIS — G4733 Obstructive sleep apnea (adult) (pediatric): Secondary | ICD-10-CM | POA: Diagnosis not present

## 2022-12-04 DIAGNOSIS — L723 Sebaceous cyst: Secondary | ICD-10-CM | POA: Diagnosis not present

## 2022-12-04 DIAGNOSIS — F17200 Nicotine dependence, unspecified, uncomplicated: Secondary | ICD-10-CM | POA: Diagnosis not present

## 2022-12-04 DIAGNOSIS — E1169 Type 2 diabetes mellitus with other specified complication: Secondary | ICD-10-CM | POA: Diagnosis not present

## 2022-12-04 DIAGNOSIS — G5603 Carpal tunnel syndrome, bilateral upper limbs: Secondary | ICD-10-CM | POA: Diagnosis not present

## 2022-12-04 DIAGNOSIS — M109 Gout, unspecified: Secondary | ICD-10-CM | POA: Diagnosis not present

## 2022-12-04 DIAGNOSIS — Z8551 Personal history of malignant neoplasm of bladder: Secondary | ICD-10-CM | POA: Diagnosis not present

## 2022-12-04 DIAGNOSIS — E7800 Pure hypercholesterolemia, unspecified: Secondary | ICD-10-CM | POA: Diagnosis not present

## 2022-12-15 DIAGNOSIS — R82998 Other abnormal findings in urine: Secondary | ICD-10-CM | POA: Diagnosis not present

## 2022-12-15 DIAGNOSIS — D414 Neoplasm of uncertain behavior of bladder: Secondary | ICD-10-CM | POA: Diagnosis not present

## 2022-12-21 DIAGNOSIS — L723 Sebaceous cyst: Secondary | ICD-10-CM | POA: Diagnosis not present

## 2022-12-29 DIAGNOSIS — L739 Follicular disorder, unspecified: Secondary | ICD-10-CM | POA: Diagnosis not present

## 2022-12-29 DIAGNOSIS — L940 Localized scleroderma [morphea]: Secondary | ICD-10-CM | POA: Diagnosis not present

## 2022-12-29 DIAGNOSIS — L720 Epidermal cyst: Secondary | ICD-10-CM | POA: Diagnosis not present

## 2022-12-30 DIAGNOSIS — D414 Neoplasm of uncertain behavior of bladder: Secondary | ICD-10-CM | POA: Diagnosis not present

## 2022-12-30 DIAGNOSIS — Z5111 Encounter for antineoplastic chemotherapy: Secondary | ICD-10-CM | POA: Diagnosis not present

## 2023-01-06 DIAGNOSIS — D414 Neoplasm of uncertain behavior of bladder: Secondary | ICD-10-CM | POA: Diagnosis not present

## 2023-01-06 DIAGNOSIS — Z5111 Encounter for antineoplastic chemotherapy: Secondary | ICD-10-CM | POA: Diagnosis not present

## 2023-01-13 DIAGNOSIS — Z5111 Encounter for antineoplastic chemotherapy: Secondary | ICD-10-CM | POA: Diagnosis not present

## 2023-01-13 DIAGNOSIS — D414 Neoplasm of uncertain behavior of bladder: Secondary | ICD-10-CM | POA: Diagnosis not present

## 2023-03-22 DIAGNOSIS — D414 Neoplasm of uncertain behavior of bladder: Secondary | ICD-10-CM | POA: Diagnosis not present

## 2023-03-22 DIAGNOSIS — R3915 Urgency of urination: Secondary | ICD-10-CM | POA: Diagnosis not present

## 2023-03-22 DIAGNOSIS — D494 Neoplasm of unspecified behavior of bladder: Secondary | ICD-10-CM | POA: Diagnosis not present

## 2023-04-26 DIAGNOSIS — S61012A Laceration without foreign body of left thumb without damage to nail, initial encounter: Secondary | ICD-10-CM | POA: Diagnosis not present

## 2023-04-28 DIAGNOSIS — S61012A Laceration without foreign body of left thumb without damage to nail, initial encounter: Secondary | ICD-10-CM | POA: Diagnosis not present

## 2023-05-05 DIAGNOSIS — D414 Neoplasm of uncertain behavior of bladder: Secondary | ICD-10-CM | POA: Diagnosis not present

## 2023-05-05 DIAGNOSIS — Z5111 Encounter for antineoplastic chemotherapy: Secondary | ICD-10-CM | POA: Diagnosis not present

## 2023-05-12 DIAGNOSIS — D414 Neoplasm of uncertain behavior of bladder: Secondary | ICD-10-CM | POA: Diagnosis not present

## 2023-05-12 DIAGNOSIS — Z5111 Encounter for antineoplastic chemotherapy: Secondary | ICD-10-CM | POA: Diagnosis not present

## 2023-05-19 DIAGNOSIS — D414 Neoplasm of uncertain behavior of bladder: Secondary | ICD-10-CM | POA: Diagnosis not present

## 2023-05-19 DIAGNOSIS — Z5111 Encounter for antineoplastic chemotherapy: Secondary | ICD-10-CM | POA: Diagnosis not present

## 2023-05-20 DIAGNOSIS — R058 Other specified cough: Secondary | ICD-10-CM | POA: Diagnosis not present

## 2023-05-20 DIAGNOSIS — J329 Chronic sinusitis, unspecified: Secondary | ICD-10-CM | POA: Diagnosis not present

## 2023-05-20 DIAGNOSIS — J40 Bronchitis, not specified as acute or chronic: Secondary | ICD-10-CM | POA: Diagnosis not present

## 2023-06-04 DIAGNOSIS — E7800 Pure hypercholesterolemia, unspecified: Secondary | ICD-10-CM | POA: Diagnosis not present

## 2023-06-04 DIAGNOSIS — I10 Essential (primary) hypertension: Secondary | ICD-10-CM | POA: Diagnosis not present

## 2023-06-04 DIAGNOSIS — E119 Type 2 diabetes mellitus without complications: Secondary | ICD-10-CM | POA: Diagnosis not present

## 2023-06-04 DIAGNOSIS — E1169 Type 2 diabetes mellitus with other specified complication: Secondary | ICD-10-CM | POA: Diagnosis not present

## 2023-06-04 DIAGNOSIS — F3131 Bipolar disorder, current episode depressed, mild: Secondary | ICD-10-CM | POA: Diagnosis not present

## 2023-06-21 DIAGNOSIS — R3915 Urgency of urination: Secondary | ICD-10-CM | POA: Diagnosis not present

## 2023-06-21 DIAGNOSIS — R8289 Other abnormal findings on cytological and histological examination of urine: Secondary | ICD-10-CM | POA: Diagnosis not present

## 2023-06-21 DIAGNOSIS — Z125 Encounter for screening for malignant neoplasm of prostate: Secondary | ICD-10-CM | POA: Diagnosis not present

## 2023-06-21 DIAGNOSIS — D414 Neoplasm of uncertain behavior of bladder: Secondary | ICD-10-CM | POA: Diagnosis not present

## 2023-09-21 DIAGNOSIS — R8289 Other abnormal findings on cytological and histological examination of urine: Secondary | ICD-10-CM | POA: Diagnosis not present

## 2023-09-21 DIAGNOSIS — D414 Neoplasm of uncertain behavior of bladder: Secondary | ICD-10-CM | POA: Diagnosis not present

## 2023-09-29 DIAGNOSIS — E119 Type 2 diabetes mellitus without complications: Secondary | ICD-10-CM | POA: Diagnosis not present

## 2023-09-29 DIAGNOSIS — H53143 Visual discomfort, bilateral: Secondary | ICD-10-CM | POA: Diagnosis not present

## 2023-09-29 DIAGNOSIS — H401131 Primary open-angle glaucoma, bilateral, mild stage: Secondary | ICD-10-CM | POA: Diagnosis not present

## 2023-09-29 DIAGNOSIS — H35033 Hypertensive retinopathy, bilateral: Secondary | ICD-10-CM | POA: Diagnosis not present

## 2023-09-29 DIAGNOSIS — H524 Presbyopia: Secondary | ICD-10-CM | POA: Diagnosis not present

## 2023-09-29 DIAGNOSIS — I10 Essential (primary) hypertension: Secondary | ICD-10-CM | POA: Diagnosis not present

## 2023-11-16 DIAGNOSIS — Z5111 Encounter for antineoplastic chemotherapy: Secondary | ICD-10-CM | POA: Diagnosis not present

## 2023-11-16 DIAGNOSIS — D414 Neoplasm of uncertain behavior of bladder: Secondary | ICD-10-CM | POA: Diagnosis not present

## 2023-11-23 DIAGNOSIS — D414 Neoplasm of uncertain behavior of bladder: Secondary | ICD-10-CM | POA: Diagnosis not present

## 2023-11-30 DIAGNOSIS — D414 Neoplasm of uncertain behavior of bladder: Secondary | ICD-10-CM | POA: Diagnosis not present

## 2023-12-22 DIAGNOSIS — E119 Type 2 diabetes mellitus without complications: Secondary | ICD-10-CM | POA: Diagnosis not present

## 2023-12-22 DIAGNOSIS — F3131 Bipolar disorder, current episode depressed, mild: Secondary | ICD-10-CM | POA: Diagnosis not present

## 2023-12-22 DIAGNOSIS — M109 Gout, unspecified: Secondary | ICD-10-CM | POA: Diagnosis not present

## 2023-12-22 DIAGNOSIS — E7800 Pure hypercholesterolemia, unspecified: Secondary | ICD-10-CM | POA: Diagnosis not present

## 2023-12-22 DIAGNOSIS — Z720 Tobacco use: Secondary | ICD-10-CM | POA: Diagnosis not present

## 2023-12-22 DIAGNOSIS — C679 Malignant neoplasm of bladder, unspecified: Secondary | ICD-10-CM | POA: Diagnosis not present

## 2023-12-22 DIAGNOSIS — I10 Essential (primary) hypertension: Secondary | ICD-10-CM | POA: Diagnosis not present

## 2024-01-21 DIAGNOSIS — L720 Epidermal cyst: Secondary | ICD-10-CM | POA: Diagnosis not present

## 2024-01-21 DIAGNOSIS — I10 Essential (primary) hypertension: Secondary | ICD-10-CM | POA: Diagnosis not present

## 2024-01-21 DIAGNOSIS — L02212 Cutaneous abscess of back [any part, except buttock]: Secondary | ICD-10-CM | POA: Diagnosis not present

## 2024-01-24 DIAGNOSIS — H401131 Primary open-angle glaucoma, bilateral, mild stage: Secondary | ICD-10-CM | POA: Diagnosis not present

## 2024-02-08 DIAGNOSIS — D414 Neoplasm of uncertain behavior of bladder: Secondary | ICD-10-CM | POA: Diagnosis not present

## 2024-02-08 DIAGNOSIS — R3915 Urgency of urination: Secondary | ICD-10-CM | POA: Diagnosis not present

## 2024-02-08 DIAGNOSIS — R8289 Other abnormal findings on cytological and histological examination of urine: Secondary | ICD-10-CM | POA: Diagnosis not present

## 2024-02-10 ENCOUNTER — Other Ambulatory Visit: Payer: Self-pay | Admitting: Urology

## 2024-02-25 DIAGNOSIS — J449 Chronic obstructive pulmonary disease, unspecified: Secondary | ICD-10-CM | POA: Diagnosis not present

## 2024-02-25 DIAGNOSIS — E785 Hyperlipidemia, unspecified: Secondary | ICD-10-CM | POA: Diagnosis not present

## 2024-02-25 DIAGNOSIS — M1990 Unspecified osteoarthritis, unspecified site: Secondary | ICD-10-CM | POA: Diagnosis not present

## 2024-02-25 DIAGNOSIS — N400 Enlarged prostate without lower urinary tract symptoms: Secondary | ICD-10-CM | POA: Diagnosis not present

## 2024-02-25 DIAGNOSIS — E11630 Type 2 diabetes mellitus with periodontal disease: Secondary | ICD-10-CM | POA: Diagnosis not present

## 2024-02-25 DIAGNOSIS — I10 Essential (primary) hypertension: Secondary | ICD-10-CM | POA: Diagnosis not present

## 2024-02-25 DIAGNOSIS — G4733 Obstructive sleep apnea (adult) (pediatric): Secondary | ICD-10-CM | POA: Diagnosis not present

## 2024-02-25 DIAGNOSIS — F319 Bipolar disorder, unspecified: Secondary | ICD-10-CM | POA: Diagnosis not present

## 2024-02-25 NOTE — Patient Instructions (Signed)
 SURGICAL WAITING ROOM VISITATION  Patients having surgery or a procedure may have no more than 2 support people in the waiting area - these visitors may rotate.    Children under the age of 9 must have an adult with them who is not the patient.  Due to an increase in RSV and influenza rates and associated hospitalizations, children ages 84 and under may not visit patients in [facility] hospitals.  Visitors with respiratory illnesses are discouraged from visiting and should remain at home.  If the patient needs to stay at the hospital during part of their recovery, the visitor guidelines for inpatient rooms apply. Pre-op nurse will coordinate an appropriate time for 1 support person to accompany patient in pre-op.  This support person may not rotate.    Please refer to the [facility] website for the visitor guidelines for Inpatients (after your surgery is over and you are in a regular room).       Your procedure is scheduled on:  03/13/2024    Report to [facility] Main Entrance    Report to admitting at  1145 AM   Call this number if you have problems the morning of surgery [phone]   Do not eat food or drink liquids  :After Midnight.                If you have questions, please contact your surgeon's office.      Oral Hygiene is also important to reduce your risk of infection.                                    Remember - BRUSH YOUR TEETH THE MORNING OF SURGERY WITH YOUR REGULAR TOOTHPASTE  DENTURES WILL BE REMOVED PRIOR TO SURGERY PLEASE DO NOT APPLY "Poly grip" OR ADHESIVES!!!   Do NOT smoke after Midnight   Stop all vitamins and herbal supplements 7 days before surgery.   Take these medicines the morning of surgery with A SIP OF WATER:  cardura                Actos-   DO NOT TAKE ANY ORAL DIABETIC MEDICATIONS DAY OF YOUR SURGERY  Bring CPAP mask and tubing day of surgery.                              You may not have any metal on your body  including hair pins, jewelry, and body piercing             Do not wear make-up, lotions, powders, perfumes/cologne, or deodorant  Do not wear nail polish including gel and S&S, artificial/acrylic nails, or any other type of covering on natural nails including finger and toenails. If you have artificial nails, gel coating, etc. that needs to be removed by a nail salon please have this removed prior to surgery or surgery may need to be canceled/ delayed if the surgeon/ anesthesia feels like they are unable to be safely monitored.   Do not shave  48 hours prior to surgery.               Men may shave face and neck.   Do not bring valuables to the hospital. [facility] IS NOT             RESPONSIBLE   FOR VALUABLES.   Contacts, glasses, dentures or  bridgework may not be worn into surgery.   Bring small overnight bag day of surgery.   DO NOT BRING YOUR HOME MEDICATIONS TO THE HOSPITAL. PHARMACY WILL DISPENSE MEDICATIONS LISTED ON YOUR MEDICATION LIST TO YOU DURING YOUR ADMISSION IN THE HOSPITAL!    Patients discharged on the day of surgery will not be allowed to drive home.  Someone NEEDS to stay with you for the first 24 hours after anesthesia.   Special Instructions: Bring a copy of your healthcare power of attorney and living will documents the day of surgery if you haven't scanned them before.              Please read over the following fact sheets you were given: IF YOU HAVE QUESTIONS ABOUT YOUR PRE-OP INSTRUCTIONS PLEASE CALL [phone]   If you received a COVID test during your pre-op visit  it is requested that you wear a mask when out in public, stay away from anyone that may not be feeling well and notify your surgeon if you develop symptoms. If you test positive for Covid or have been in contact with anyone that has tested positive in the last 10 days please notify you surgeon.    [place] - Preparing for Surgery Before surgery, you can play an important role.  Because skin  is not sterile, your skin needs to be as free of germs as possible.  You can reduce the number of germs on your skin by washing with CHG (chlorahexidine gluconate) soap before surgery.  CHG is an antiseptic cleaner which kills germs and bonds with the skin to continue killing germs even after washing. Please DO NOT use if you have an allergy to CHG or antibacterial soaps.  If your skin becomes reddened/irritated stop using the CHG and inform your nurse when you arrive at Short Stay. Do not shave (including legs and underarms) for at least 48 hours prior to the first CHG shower.  You may shave your face/neck. Please follow these instructions carefully:  1.  Shower with CHG Soap the night before surgery and the  morning of Surgery.  2.  If you choose to wash your hair, wash your hair first as usual with your  normal  shampoo.  3.  After you shampoo, rinse your hair and body thoroughly to remove the  shampoo.                           4.  Use CHG as you would any other liquid soap.  You can apply chg directly  to the skin and wash                       Gently with a scrungie or clean washcloth.  5.  Apply the CHG Soap to your body ONLY FROM THE NECK DOWN.   Do not use on face/ open                           Wound or open sores. Avoid contact with eyes, ears mouth and genitals (private parts).                       Wash face,  Genitals (private parts) with your normal soap.             6.  Wash thoroughly, paying special attention to the area where your surgery  will  be performed.  7.  Thoroughly rinse your body with warm water from the neck down.  8.  DO NOT shower/wash with your normal soap after using and rinsing off  the CHG Soap.                9.  Pat yourself dry with a clean towel.            10.  Wear clean pajamas.            11.  Place clean sheets on your bed the night of your first shower and do not  sleep with pets. Day of Surgery : Do not apply any lotions/deodorants the morning of  surgery.  Please wear clean clothes to the hospital/surgery center.  FAILURE TO FOLLOW THESE INSTRUCTIONS MAY RESULT IN THE CANCELLATION OF YOUR SURGERY PATIENT SIGNATURE_________________________________  NURSE SIGNATURE__________________________________  ________________________________________________________________________

## 2024-02-25 NOTE — Progress Notes (Addendum)
 Anesthesia Review:  PCP: Trevor David LOV 12/22/23  Cardiologist : none   PPM/ ICD: Device Orders: Rep Notified:  Chest x-ray : EKG : 02/29/24 Echo : Stress test: Cardiac Cath :   Activity level: can do a flight of stairs without difficulty  Sleep Study/ CPAP : none  Fasting Blood Sugar :      / Checks Blood Sugar -- times a day:     DM- type 2- does not check glucose at home  Hgba1c-  02/29/24- 6.2 Actos-  none am of surgery   Blood Thinner/ Instructions /Last Dose: ASA / Instructions/ Last Dose :    Home Health nurse has just started coming as of 02/25/24.  PT unsure of how often she is going to come.  Wife deceased a year ago per pt .  Son moved in with pt as of 2 weeks ago.     BMP with Potassium of 5.3 routed to DR Trevor David on 02/29/24.

## 2024-02-29 ENCOUNTER — Other Ambulatory Visit: Payer: Self-pay

## 2024-02-29 ENCOUNTER — Encounter (HOSPITAL_COMMUNITY)
Admission: RE | Admit: 2024-02-29 | Discharge: 2024-02-29 | Disposition: A | Discharge status: Home / Self Care | Source: Ambulatory Visit | Attending: Urology | Admitting: Urology

## 2024-02-29 ENCOUNTER — Encounter (HOSPITAL_COMMUNITY): Payer: Self-pay

## 2024-02-29 VITALS — BP 153/67 | HR 61 | Temp 98.5°F | Resp 16 | Ht 68.0 in | Wt 254.0 lb

## 2024-02-29 DIAGNOSIS — Z01818 Encounter for other preprocedural examination: Secondary | ICD-10-CM | POA: Diagnosis not present

## 2024-02-29 LAB — CBC
HCT: 38.5 % — ABNORMAL LOW (ref 39.0–52.0)
Hemoglobin: 12.7 g/dL — ABNORMAL LOW (ref 13.0–17.0)
MCH: 32.9 pg (ref 26.0–34.0)
MCHC: 33 g/dL (ref 30.0–36.0)
MCV: 99.7 fL (ref 80.0–100.0)
Platelets: 228 10*3/uL (ref 150–400)
RBC: 3.86 MIL/uL — ABNORMAL LOW (ref 4.22–5.81)
RDW: 13.1 % (ref 11.5–15.5)
WBC: 6.5 10*3/uL (ref 4.0–10.5)
nRBC: 0 % (ref 0.0–0.2)

## 2024-02-29 LAB — HEMOGLOBIN A1C
Hgb A1c MFr Bld: 6.2 % — ABNORMAL HIGH (ref 4.8–5.6)
Mean Plasma Glucose: 131.24 mg/dL

## 2024-02-29 LAB — BASIC METABOLIC PANEL
Anion gap: 3 — ABNORMAL LOW (ref 5–15)
BUN: 20 mg/dL (ref 8–23)
CO2: 27 mmol/L (ref 22–32)
Calcium: 9.2 mg/dL (ref 8.9–10.3)
Chloride: 104 mmol/L (ref 98–111)
Creatinine, Ser: 0.97 mg/dL (ref 0.61–1.24)
GFR, Estimated: >60 mL/min (ref >60)
Glucose, Bld: 118 mg/dL — ABNORMAL HIGH (ref 70–99)
Potassium: 5.3 mmol/L — ABNORMAL HIGH (ref 3.5–5.1)
Sodium: 134 mmol/L — ABNORMAL LOW (ref 135–145)

## 2024-02-29 LAB — GLUCOSE, CAPILLARY: Glucose-Capillary: 113 mg/dL — ABNORMAL HIGH (ref 70–99)

## 2024-03-01 NOTE — Anesthesia Preprocedure Evaluation (Addendum)
 Anesthesia Evaluation  Patient identified by MRN, date of birth, ID band Patient awake    Reviewed: Allergy & Precautions, NPO status , Patient's Chart, lab work & pertinent test results  Airway Mallampati: III  TM Distance: >3 FB Neck ROM: Full    Dental  (+) Dental Advisory Given   Pulmonary sleep apnea , Patient abstained from smoking., former smoker   breath sounds clear to auscultation       Cardiovascular hypertension, Pt. on medications  Rhythm:Regular Rate:Normal     Neuro/Psych negative neurological ROS     GI/Hepatic negative GI ROS,,,  Endo/Other  diabetes    Renal/GU      Musculoskeletal   Abdominal   Peds  Hematology   Anesthesia Other Findings   Reproductive/Obstetrics                             Anesthesia Physical Anesthesia Plan  ASA: 2  Anesthesia Plan: General   Post-op Pain Management: Tylenol PO (pre-op)* and Minimal or no pain anticipated   Induction: Intravenous  PONV Risk Score and Plan: 2 and Dexamethasone, Ondansetron and Treatment may vary due to age or medical condition  Airway Management Planned: Oral ETT and Video Laryngoscope Planned  Additional Equipment:   Intra-op Plan:   Post-operative Plan: Extubation in OR  Informed Consent: I have reviewed the patients History and Physical, chart, labs and discussed the procedure including the risks, benefits and alternatives for the proposed anesthesia with the patient or authorized representative who has indicated his/her understanding and acceptance.     Dental advisory given  Plan Discussed with: CRNA  Anesthesia Plan Comments: (Reviewed. PMH of smoking, DM, HTN, HLD, OSA (no CPAP), bipolar d/o, bladder cancer s/p multiple TURBTs. Seen by PCP in 12/2023 and stable)        Anesthesia Quick Evaluation

## 2024-03-13 ENCOUNTER — Encounter (HOSPITAL_COMMUNITY)
Admission: RE | Disposition: A | Discharge status: Home / Self Care | Payer: Self-pay | Source: Home / Self Care | Attending: Urology

## 2024-03-13 ENCOUNTER — Ambulatory Visit (HOSPITAL_COMMUNITY): Payer: Self-pay | Admitting: Medical

## 2024-03-13 ENCOUNTER — Ambulatory Visit (HOSPITAL_COMMUNITY)
Admission: RE | Admit: 2024-03-13 | Discharge: 2024-03-13 | Disposition: A | Discharge status: Home / Self Care | Attending: Urology | Admitting: Urology

## 2024-03-13 ENCOUNTER — Ambulatory Visit (HOSPITAL_BASED_OUTPATIENT_CLINIC_OR_DEPARTMENT_OTHER): Admitting: Certified Registered Nurse Anesthetist

## 2024-03-13 ENCOUNTER — Encounter (HOSPITAL_COMMUNITY): Payer: Self-pay | Admitting: Urology

## 2024-03-13 DIAGNOSIS — Z7984 Long term (current) use of oral hypoglycemic drugs: Secondary | ICD-10-CM | POA: Diagnosis not present

## 2024-03-13 DIAGNOSIS — D494 Neoplasm of unspecified behavior of bladder: Secondary | ICD-10-CM | POA: Diagnosis not present

## 2024-03-13 DIAGNOSIS — G4733 Obstructive sleep apnea (adult) (pediatric): Secondary | ICD-10-CM | POA: Diagnosis not present

## 2024-03-13 DIAGNOSIS — E119 Type 2 diabetes mellitus without complications: Secondary | ICD-10-CM

## 2024-03-13 DIAGNOSIS — G4730 Sleep apnea, unspecified: Secondary | ICD-10-CM | POA: Insufficient documentation

## 2024-03-13 DIAGNOSIS — Z01818 Encounter for other preprocedural examination: Secondary | ICD-10-CM

## 2024-03-13 DIAGNOSIS — I10 Essential (primary) hypertension: Secondary | ICD-10-CM

## 2024-03-13 DIAGNOSIS — C679 Malignant neoplasm of bladder, unspecified: Secondary | ICD-10-CM | POA: Insufficient documentation

## 2024-03-13 DIAGNOSIS — N201 Calculus of ureter: Secondary | ICD-10-CM

## 2024-03-13 DIAGNOSIS — Z79899 Other long term (current) drug therapy: Secondary | ICD-10-CM | POA: Insufficient documentation

## 2024-03-13 DIAGNOSIS — N35912 Unspecified bulbous urethral stricture, male: Secondary | ICD-10-CM | POA: Insufficient documentation

## 2024-03-13 DIAGNOSIS — Z87891 Personal history of nicotine dependence: Secondary | ICD-10-CM | POA: Insufficient documentation

## 2024-03-13 DIAGNOSIS — D090 Carcinoma in situ of bladder: Secondary | ICD-10-CM | POA: Diagnosis not present

## 2024-03-13 LAB — GLUCOSE, CAPILLARY
Glucose-Capillary: 96 mg/dL (ref 70–99)
Glucose-Capillary: 118 mg/dL — ABNORMAL HIGH (ref 70–99)

## 2024-03-13 SURGERY — TURBT, WITH CHEMOTHERAPEUTIC AGENT INSTILLATION INTO BLADDER
Anesthesia: General | Site: Bladder

## 2024-03-13 MED ORDER — PROPOFOL 10 MG/ML IV BOLUS
INTRAVENOUS | Status: AC
  Filled 2024-03-13: qty 20

## 2024-03-13 MED ORDER — LIDOCAINE HCL (PF) 2 % IJ SOLN
INTRAMUSCULAR | Status: DC | PRN
Start: 2024-03-13 — End: 2024-03-13
  Administered 2024-03-13: 100 mg via INTRADERMAL

## 2024-03-13 MED ORDER — AMISULPRIDE (ANTIEMETIC) 5 MG/2ML IV SOLN: 10.0000 mg | Freq: Once | INTRAVENOUS | Status: DC | PRN

## 2024-03-13 MED ORDER — GEMCITABINE CHEMO FOR BLADDER INSTILLATION 2000 MG
2000.0000 mg | Freq: Once | INTRAVENOUS | Status: AC
  Administered 2024-03-13: 2000 mg via INTRAVESICAL
  Filled 2024-03-13: qty 2000

## 2024-03-13 MED ORDER — ONDANSETRON HCL 4 MG/2ML IJ SOLN
INTRAMUSCULAR | Status: AC
  Filled 2024-03-13: qty 2

## 2024-03-13 MED ORDER — ROCURONIUM BROMIDE 10 MG/ML (PF) SYRINGE
PREFILLED_SYRINGE | INTRAVENOUS | Status: DC | PRN
  Administered 2024-03-13: 60 mg via INTRAVENOUS

## 2024-03-13 MED ORDER — FENTANYL CITRATE PF 50 MCG/ML IJ SOSY: 25.0000 ug | PREFILLED_SYRINGE | INTRAMUSCULAR | Status: DC | PRN

## 2024-03-13 MED ORDER — 0.9 % SODIUM CHLORIDE (POUR BTL) OPTIME
TOPICAL | Status: DC | PRN
  Administered 2024-03-13: 1000 mL

## 2024-03-13 MED ORDER — LACTATED RINGERS IV SOLN: INTRAVENOUS | Status: DC

## 2024-03-13 MED ORDER — ONDANSETRON HCL 4 MG/2ML IJ SOLN
INTRAMUSCULAR | Status: DC | PRN
  Administered 2024-03-13: 4 mg via INTRAVENOUS

## 2024-03-13 MED ORDER — ACETAMINOPHEN 500 MG PO TABS
ORAL_TABLET | ORAL | Status: AC
  Filled 2024-03-13: qty 2

## 2024-03-13 MED ORDER — CEFAZOLIN SODIUM-DEXTROSE 2-4 GM/100ML-% IV SOLN
2.0000 g | INTRAVENOUS | Status: AC
  Administered 2024-03-13: 2 g via INTRAVENOUS
  Filled 2024-03-13: qty 100

## 2024-03-13 MED ORDER — ORAL CARE MOUTH RINSE: 15.0000 mL | Freq: Once | OROMUCOSAL | Status: AC

## 2024-03-13 MED ORDER — FENTANYL CITRATE (PF) 100 MCG/2ML IJ SOLN
INTRAMUSCULAR | Status: AC
  Filled 2024-03-13: qty 2

## 2024-03-13 MED ORDER — DEXAMETHASONE SODIUM PHOSPHATE 10 MG/ML IJ SOLN
INTRAMUSCULAR | Status: DC | PRN
  Administered 2024-03-13: 10 mg via INTRAVENOUS

## 2024-03-13 MED ORDER — SODIUM CHLORIDE 0.9 % IR SOLN
Status: DC | PRN
  Administered 2024-03-13: 3000 mL

## 2024-03-13 MED ORDER — LIDOCAINE HCL (PF) 2 % IJ SOLN
INTRAMUSCULAR | Status: AC
  Filled 2024-03-13: qty 5

## 2024-03-13 MED ORDER — PROPOFOL 10 MG/ML IV BOLUS
INTRAVENOUS | Status: DC | PRN
  Administered 2024-03-13: 130 mg via INTRAVENOUS

## 2024-03-13 MED ORDER — OXYCODONE-ACETAMINOPHEN 5-325 MG PO TABS: 1.0000 | ORAL_TABLET | ORAL | 0 refills | Status: AC | PRN

## 2024-03-13 MED ORDER — FENTANYL CITRATE (PF) 100 MCG/2ML IJ SOLN
INTRAMUSCULAR | Status: DC | PRN
  Administered 2024-03-13 (×2): 50 ug via INTRAVENOUS

## 2024-03-13 MED ORDER — CHLORHEXIDINE GLUCONATE 0.12 % MT SOLN
15.0000 mL | Freq: Once | OROMUCOSAL | Status: AC
  Administered 2024-03-13: 15 mL via OROMUCOSAL

## 2024-03-13 MED ORDER — SUGAMMADEX SODIUM 200 MG/2ML IV SOLN
INTRAVENOUS | Status: DC | PRN
Start: 2024-03-13 — End: 2024-03-13
  Administered 2024-03-13: 200 mg via INTRAVENOUS

## 2024-03-13 MED ORDER — DEXAMETHASONE SODIUM PHOSPHATE 10 MG/ML IJ SOLN
INTRAMUSCULAR | Status: AC
  Filled 2024-03-13: qty 1

## 2024-03-13 SURGICAL SUPPLY — 17 items
BAG URINE DRAIN 2000ML AR STRL (UROLOGICAL SUPPLIES) IMPLANT
BAG URO CATCHER STRL LF (MISCELLANEOUS) ×1 IMPLANT
CATH FOLEY 2WAY SLVR 5CC 18FR (CATHETERS) IMPLANT
DRAPE FOOT SWITCH (DRAPES) ×1 IMPLANT
ELECT REM PT RETURN 15FT ADLT (MISCELLANEOUS) ×1 IMPLANT
EVACUATOR MICROVAS BLADDER (UROLOGICAL SUPPLIES) IMPLANT
GLOVE BIOGEL M 7.0 STRL (GLOVE) ×1 IMPLANT
GOWN STRL REUS W/ TWL XL LVL3 (GOWN DISPOSABLE) ×1 IMPLANT
HOLDER FOLEY CATH W/STRAP (MISCELLANEOUS) IMPLANT
KIT TURNOVER KIT A (KITS) IMPLANT
LOOP CUT BIPOLAR 24F LRG (ELECTROSURGICAL) IMPLANT
MANIFOLD NEPTUNE II (INSTRUMENTS) ×1 IMPLANT
PACK CYSTO (CUSTOM PROCEDURE TRAY) ×1 IMPLANT
SYR TOOMEY IRRIG 70ML (MISCELLANEOUS) IMPLANT
SYRINGE TOOMEY IRRIG 70ML (MISCELLANEOUS) IMPLANT
TUBING CONNECTING 10 (TUBING) ×1 IMPLANT
TUBING UROLOGY SET (TUBING) ×1 IMPLANT

## 2024-03-13 NOTE — H&P (Signed)
 Office Visit Report     02/08/2024   --------------------------------------------------------------------------------   Trevor David  MRN: [id number]  DOB: 1946-04-01, 78 year old Male  SSN: [id number]   PRIMARY CARE:  Arvella Merles, MD  PRIMARY CARE FAX:  [phone]  REFERRING:  Jannifer Hick, MD  PROVIDER:  Jettie Pagan, M.D.  LOCATION:  [facility] [phone]     --------------------------------------------------------------------------------   CC/HPI: Trevor David is a 79 year old male seen in follow-up with a history of bladder cancer.   1. 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 Muscularis propria was not present. He did receive epirubicin. He did not receive BCG.  -Surveillance cystoscopy 04/08/2022 with 2 separate subcentimeter papillary lesions around the left ureteral orifice.  -TURBT 04/24/2022 with HG Ta UCB.  -S/p BCG induction completed 06/2022  -S/p TURBT 09/07/2022 for posterior bladder wall erythema. Pathology: Benign urothelium, submucosa and muscularis with prominent inflammation and reactive changes.  -Surveillance cysto 12/15/2022 NED, 03/22/2023 NED, 06/21/2023 NED, 09/21/2023 NED  -Maintenance BCG: 82mo (3/3 completed in 01/2023), 70mo (completed 05/2023), 12 mo (completed 11/2024)  -Surveillance cystoscopy 02/08/2024: Erythema irregularity on the left lateral wall behind UVJ approximately centimeter.   #2. Prostate cancer screening: Last PSA in 06/2023 was 0.41. He denies a family history of prostate cancer. He denies taking 5 alpha reductase inhibitors.   #3. Bulbar urethral stricture: Initially diagnosed with cystoscopy 04/08/2022 with 18 French small segment bulbar urethral stricture easily passable with cystoscope. He does have a weaker flow stream. He does have some mild urgency. He states his symptoms are nonbothersome enough to warrant medication this  time.   Patient currently denies fever, chills, sweats, nausea, vomiting, abdominal or flank pain, gross hematuria or dysuria.     ALLERGIES: No Allergies    MEDICATIONS: Cardura 8 mg tablet  Lisinopril 5 mg tablet  Actos 45 mg tablet  Advil  Aspirin Ec 81 mg tablet, delayed release  Coq10  Furosemide 20 mg tablet  Ibuprofen 800 mg tablet  Joint Health  Krill Oil     GU PSH: Bladder Instill AntiCA Agent - 11/30/2023, 11/23/2023, 11/16/2023, 05/19/2023, 05/12/2023, 05/05/2023, 01/13/2023, 01/06/2023, 12/30/2022, 07/03/2022, 06/26/2022, 06/19/2022, 06/12/2022, 06/05/2022, 05/29/2022, 2019 Cystoscopy - 09/21/2023, 06/21/2023, 03/22/2023, 12/15/2022, 07/28/2022, 04/08/2022, 2022, 2021, 2020, 2020, 2019, 2019, 2019 Cystoscopy TURBT <2 cm - 09/07/2022, 04/24/2022, 2019 Locm 300-399Mg /Ml Iodine,1Ml - 2019     NON-GU PSH: Lumbar Laminectomy, Right Rotator Cuff Surgery.., Left     GU PMH: Bladder tumor/neoplasm, Left - 11/30/2023, Left, - 11/23/2023, Left, - 11/16/2023, Left, - 09/21/2023, Left, - 06/21/2023, Left, - 05/19/2023, Left, - 05/12/2023, Left, - 05/05/2023, Left, - 03/22/2023, Left, - 01/13/2023, Left, - 01/06/2023, Left, - 12/30/2022, Left, - 12/15/2022, Left, - 09/14/2022, Left, - 07/28/2022, Left, - 07/03/2022, Left, - 06/19/2022, Left, - 06/12/2022, Left, - 06/05/2022, Left, - 04/30/2022, Left, - 04/08/2022, Left, - 2022, Left, He has a papillary bladder tumor on the floor of the bladder on the left-hand side that looks like it is going to be superficial., - 2019 History of bladder cancer - 11/16/2023, - 01/13/2023, - 06/26/2022, - 06/19/2022, - 05/29/2022, He had no evidence of recurrent transitional cell carcinoma of the bladder noted cystoscopically today. He had a solitary, superficial, low-grade lesion and therefore will return for surveillance cystoscopy again in 1 year., - 2021 (Stable), He was noted to have no evidence of recurrent transitional cell carcinoma. He had a  low-grade, superficial, solitary lesion and  therefore I will advance his surveillance to 6 months. If he is clear next time he can go to yearly cystoscopies thereafter., - 2020 (Stable), He had no evidence of recurrent transitional cell carcinoma of the bladder noted cystoscopically today. His tumor was low-grade and superficial. I therefore told him that we could advance the duration between cystoscopic evaluations to 6 months., - 2020 (Stable), He had no evidence of recurrent transitional cell carcinoma the bladder noted cystoscopically today. He will return again in 3 months for routine surveillance cystoscopy., - 2019, - 2019, I will have him return in 3 months for surveillance cystoscopy., - 2019 Encounter for Prostate Cancer screening - 09/21/2023, - 06/21/2023, - 03/22/2023, - 12/15/2022, - 09/14/2022, - 07/28/2022, - 04/30/2022, - 04/08/2022 (Stable), - 2022 (Stable), It has been a year since his last PSA. One will be obtained today and I will contact him with the results., - 2020, His prostate was noted to be benign. It has been 2 years since his last PSA so that will be checked as well today., - 2019 Urinary Urgency - 06/21/2023, - 03/22/2023, - 12/15/2022, - 04/30/2022, - 2022 Urge incontinence - 2022, He has developed some urgency with urge incontinence. He is currently taking Cardura 8 mg and said that this really did not improve his symptoms. I have given him samples of Myrbetriq 25 mg and told him that we may need to increase the dosage to 50 mg. He will let me know how this has worked for him., - 2021 BPH w/o LUTS (Stable), He does have some BPH noted cystoscopically I also note that his PSA was last time it was checked in 3/19 he will be due for screening PSA again when he returns in 3 months. - 2019 Gross hematuria (Stable), His hematuria is clearly secondary to the bladder tumor identified. - 2019, He has experienced gross hematuria and has been and continues to be a cigarette smoker. I therefore have recommended a full hematuria evaluation with  upper tract imaging and cystoscopy. I will check a creatinine today., - 2019    NON-GU PMH: Anxiety Depression Diabetes Type 2 Glaucoma Gout Sleep Apnea    FAMILY HISTORY: Myocardial Infarction - Mother, Father   SOCIAL HISTORY: Marital Status: Married Preferred Language: English; Ethnicity: Not Hispanic Or Latino; Race: White Current Smoking Status: Patient smokes. Has smoked since 02/05/1968. Smokes 1 pack per day.   Tobacco Use Assessment Completed: Used Tobacco in last 30 days? Drinks 4+ caffeinated drinks per day.    REVIEW OF SYSTEMS:    GU Review Male:   Patient denies frequent urination, hard to postpone urination, burning/ pain with urination, get up at night to urinate, leakage of urine, stream starts and stops, trouble starting your stream, have to strain to urinate , erection problems, and penile pain.  Gastrointestinal (Upper):   Patient denies nausea, vomiting, and indigestion/ heartburn.  Gastrointestinal (Lower):   Patient denies diarrhea and constipation.  Constitutional:   Patient denies fever, night sweats, weight loss, and fatigue.  Skin:   Patient denies skin rash/ lesion and itching.  Eyes:   Patient denies blurred vision and double vision.  Ears/ Nose/ Throat:   Patient denies sore throat and sinus problems.  Hematologic/Lymphatic:   Patient denies swollen glands and easy bruising.  Cardiovascular:   Patient denies leg swelling and chest pains.  Respiratory:   Patient denies cough and shortness of breath.  Endocrine:   Patient denies excessive thirst.  Musculoskeletal:  Patient denies joint pain and back pain.  Neurological:   Patient denies headaches and dizziness.  Psychologic:   Patient denies depression and anxiety.   VITAL SIGNS: None   MULTI-SYSTEM PHYSICAL EXAMINATION:    Constitutional: Well-nourished. No physical deformities. Normally developed. Good grooming.  Respiratory: No labored breathing, no use of accessory muscles.   Cardiovascular:  Normal temperature, normal extremity pulses, no swelling, no varicosities.  Gastrointestinal: No mass, no tenderness, no rigidity, non obese abdomen.     Complexity of Data:  Source Of History:  Patient, Medical Record Summary  Records Review:   AUA Symptom Score, Pathology Reports, Previous Doctor Records, Previous Patient Records  Urine Test Review:   Urinalysis   06/21/23 01/25/19 01/18/19 03/04/18  PSA  Total PSA 0.41 ng/mL 0.15 ng/mL 0.18 ng/mL 0.16 ng/mL    PROCEDURES:         Flexible Cystoscopy - 52000  Risks, benefits, and some of the potential complications of the procedure were discussed at length with the patient including infection, bleeding, voiding discomfort, urinary retention, fever, chills, sepsis, and others. All questions were answered. Informed consent was obtained. Antibiotic prophylaxis was given. Sterile technique and intraurethral analgesia were used.  Meatus:  Normal size. Normal location. Normal condition.  Urethra:  No strictures.  External Sphincter:  Normal.  Verumontanum:  Normal.  Prostate:  Non-obstructing. No hyperplasia.  Bladder Neck:  Non-obstructing.  Ureteral Orifices:  Normal location. Normal size. Normal shape. Effluxed clear urine.  Bladder:  Erythema irregularity on the left lateral wall behind the UVJ of prior resection site. Unable to rule out recurrent cancer.      The lower urinary tract was carefully examined. The procedure was well-tolerated and without complications. Antibiotic instructions were given. Instructions were given to call the office immediately for bloody urine, difficulty urinating, urinary retention, painful or frequent urination, fever, chills, nausea, vomiting or other illness. The patient stated that he understood these instructions and would comply with them.         Urinalysis w/Scope Dipstick Dipstick Cont'd Micro  Color: Yellow Bilirubin: Neg mg/dL WBC/hpf: NS (Not Seen)  Appearance: Clear Ketones: Neg mg/dL  RBC/hpf: 0 - 2/hpf  Specific Gravity: 1.025 Blood: 1+ ery/uL Bacteria: Rare (0-9/hpf)  pH: 5.5 Protein: Trace mg/dL Cystals: NS (Not Seen)  Glucose: Neg mg/dL Urobilinogen: 0.2 mg/dL Casts: NS (Not Seen)    Nitrites: Neg Trichomonas: Not Present    Leukocyte Esterase: Neg leu/uL Mucous: Not Present      Epithelial Cells: NS (Not Seen)      Yeast: NS (Not Seen)      Sperm: Not Present    ASSESSMENT:      ICD-10 Details  1 GU:   Bladder tumor/neoplasm - D41.4 Left  2   Encounter for Prostate Cancer screening - Z12.5   3   Urinary Urgency - R39.15    PLAN:           Orders Labs Urine Cytology          Schedule Return Visit/Planned Activity: Next Available Appointment - Schedule Surgery          Document Letter(s):  Created for Patient: Clinical Summary         Notes:    #1. 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  -TURBT 09/07/2022 for posterior bladder wall erythema that was benign.  -He completed 25-month maintenance BCG in 11/2023  -  Cystoscopy today with irregularity and erythema on the left lateral wall behind UVJ. Recommend proceeding the operating room for TURBT.   Risks and benefits of Transurethral Resection of Bladder Tumor were reviewed in detail including infection, bleeding, blood transfusion, injury to bladder/urethra/surrounding structures, bladder perforation, obstructive and irritative voiding symptoms, and global anesthesia risks including but not limited to CVA, MI, DVT, PE, pneumonia, and death. Patient expressed understanding and desire to proceed.    #2. Prostate cancer screening: PSA in 06/2023 was remarkably low at 0.41. We did discuss that after age 29, we typically stop screening. He elects to continue screening. DRE 40 g, no nodules in 06/2023.   #3. Bulbar reserve stricture: 85 French urethral stricture by cystoscope 04/2022. He elects  surveillance.   CC: Holley Bouche, MD    Urology Preoperative H&P   Chief Complaint: Bladder tumor  History of Present Illness: Trevor David is a 78 y.o. male with bladder cancer here for TURBT. Denies fevers, chills, dysuria.    Past Medical History:  Diagnosis Date   Anxiety    Arthritis    Thumb and fingers   Aspiration pneumonia (HCC)    bacterial   Bladder tumor    Cancer (HCC)    Bladder   Depression    Diabetes mellitus without complication (HCC)    Hyperlipidemia    Hypertension    Left hydrocele    Small   Obesity    OSA on CPAP    No CPAP    Past Surgical History:  Procedure Laterality Date   COLONOSCOPY     EYE SURGERY Bilateral    cataract   LAMINECTOMY  1990's   ROTATOR CUFF REPAIR Left 2015   Dr. Madelon Lips   TRANSURETHRAL RESECTION OF BLADDER TUMOR N/A 04/15/2018   Procedure: TRANSURETHRAL RESECTION OF BLADDER TUMOR (TURBT);  Surgeon: Ihor Gully, MD;  Location: [facility];  Service: Urology;  Laterality: N/A;   TRANSURETHRAL RESECTION OF BLADDER TUMOR N/A 04/24/2022   Procedure: TRANSURETHRAL RESECTION OF BLADDER TUMOR (TURBT);  Surgeon: Jannifer Hick, MD;  Location: [facility];  Service: Urology;  Laterality: N/A;   TRANSURETHRAL RESECTION OF BLADDER TUMOR N/A 09/07/2022   Procedure: TRANSURETHRAL RESECTION OF BLADDER TUMOR (TURBT/ CYSTOSCOPY/ BILATERAL RETROGRADE;  Surgeon: Jannifer Hick, MD;  Location: [facility];  Service: Urology;  Laterality: N/A;  ONLY NEEDS 30 MIN    Allergies: No Known Allergies  History reviewed. No pertinent family history.  Social History:  reports that he quit smoking about 2 years ago. His smoking use included cigarettes. He started smoking about 42 years ago. He has a 20.1 pack-year smoking history. He has never used smokeless tobacco. He reports current alcohol use. He reports that he does not use drugs.  ROS: A complete review of systems was performed.  All systems are negative except  for pertinent findings as noted.  Physical Exam:  Vital signs in last 24 hours: Temp:  [98.2 F (36.8 C)] 98.2 F (36.8 C) (04/07 1157) Pulse Rate:  [66] 66 (04/07 1157) Resp:  [18] 18 (04/07 1157) BP: (157)/(62) 157/62 (04/07 1157) SpO2:  [99 %] 99 % (04/07 1157) Weight:  [115.2 kg] 115.2 kg (04/07 1146) Constitutional:  Alert and oriented, No acute distress Cardiovascular: Regular rate and rhythm Respiratory: Normal respiratory effort, Lungs clear bilaterally GI: Abdomen is soft, nontender, nondistended, no abdominal masses GU: No CVA tenderness Lymphatic: No lymphadenopathy Neurologic: Grossly intact, no focal deficits Psychiatric: Normal mood and affect  Laboratory  Data:  No results for input(s): "WBC", "HGB", "HCT", "PLT" in the last 72 hours.  No results for input(s): "NA", "K", "CL", "GLUCOSE", "BUN", "CALCIUM", "CREATININE" in the last 72 hours.  Invalid input(s): "CO3"   No results found for this or any previous visit (from the past 24 hours). No results found for this or any previous visit (from the past 240 hours).  Renal Function: No results for input(s): "CREATININE" in the last 168 hours. Estimated Creatinine Clearance: 77.3 mL/min (by C-G formula based on SCr of 0.97 mg/dL).  Radiologic Imaging: No results found.  I independently reviewed the above imaging studies.  Assessment and Plan Trevor David is a 78 y.o. male with bladder cancer here for TURBT. Denies fevers, chills, dysuria.   -The risks, benefits and alternatives of cystoscopy with left JJ stent placement was discussed with the patient.  Risks include, but are not limited to: bleeding, urinary tract infection, ureteral injury, ureteral stricture disease, chronic pain, urinary symptoms, bladder injury, stent migration, the need for nephrostomy tube placement, MI, CVA, DVT, PE and the inherent risks with general anesthesia.  The patient voices understanding and wishes to proceed.    Matt R. Leonides Minder  MD 03/13/2024, 12:21 PM  [facility] Pager: [phone]): [phone]

## 2024-03-13 NOTE — Anesthesia Procedure Notes (Signed)
 Procedure Name: Intubation Date/Time: 03/13/2024 2:04 PM  Performed by: Ludwig Lean, CRNAPre-anesthesia Checklist: Patient identified, Emergency Drugs available, Suction available and Patient being monitored Patient Re-evaluated:Patient Re-evaluated prior to induction Oxygen Delivery Method: Circle system utilized Preoxygenation: Pre-oxygenation with 100% oxygen Induction Type: IV induction Ventilation: Mask ventilation without difficulty Laryngoscope Size: Glidescope and 4 Grade View: Grade I Tube type: Oral Tube size: 7.5 mm Number of attempts: 1 Airway Equipment and Method: Stylet Placement Confirmation: ETT inserted through vocal cords under direct vision, positive ETCO2 and breath sounds checked- equal and bilateral Secured at: 23 cm Tube secured with: Tape Dental Injury: Teeth and Oropharynx as per pre-operative assessment

## 2024-03-13 NOTE — Anesthesia Postprocedure Evaluation (Signed)
 Anesthesia Post Note  Patient: Trevor David  Procedure(s) Performed: TURBT, WITH CHEMOTHERAPEUTIC AGENT INSTILLATION INTO BLADDER (Bladder)     Patient location during evaluation: PACU Anesthesia Type: General Level of consciousness: awake and alert Pain management: pain level controlled Vital Signs Assessment: post-procedure vital signs reviewed and stable Respiratory status: spontaneous breathing, nonlabored ventilation and respiratory function stable Cardiovascular status: blood pressure returned to baseline and stable Postop Assessment: no apparent nausea or vomiting Anesthetic complications: no   No notable events documented.  Last Vitals:  Vitals:   03/13/24 1530 03/13/24 1545  BP: (!) 156/60 (!) 153/67  Pulse: (!) 54 (!) 49  Resp: 12 11  Temp:    SpO2: 95% 96%    Last Pain:  Vitals:   03/13/24 1545  TempSrc:   PainSc: 0-No pain                 Collene Schlichter

## 2024-03-13 NOTE — Transfer of Care (Signed)
 Immediate Anesthesia Transfer of Care Note  Patient: Trevor David  Procedure(s) Performed: TURBT, WITH CHEMOTHERAPEUTIC AGENT INSTILLATION INTO BLADDER (Bladder)  Patient Location: PACU  Anesthesia Type:General  Level of Consciousness: awake and alert   Airway & Oxygen Therapy: Patient Spontanous Breathing  Post-op Assessment: Report given to RN and Post -op Vital signs reviewed and stable  Post vital signs: Reviewed and stable  Last Vitals:  Vitals Value Taken Time  BP 150/63 03/13/24 1440  Temp    Pulse 69 03/13/24 1442  Resp 14 03/13/24 1442  SpO2 96 % 03/13/24 1442  Vitals shown include unfiled device data.  Last Pain:  Vitals:   03/13/24 1157  TempSrc: Oral  PainSc: 0-No pain      Patients Stated Pain Goal: 3 (03/13/24 1157)  Complications: No notable events documented.

## 2024-03-13 NOTE — Op Note (Signed)
 Operative Note  Preoperative diagnosis:  1.  Bladder tumor  Postoperative diagnosis: 1.  Bladder tumor  Procedure(s): 1.  TURBT medium 2. Instillation of gemcitabine  Surgeon: Jettie Pagan, MD  Assistants:  None  Anesthesia:  General  Complications:  None  EBL:  Minimal  Specimens: 1.  ID Type Source Tests Collected by Time Destination  1 : Left lateral wall bladder tumor Tissue PATH GU resection / TURBT / partial nephrectomy SURGICAL PATHOLOGY Jannifer Hick, MD 03/13/2024 1414    Drains/Catheters: 1.  16 Fr foley  Intraoperative findings:   Area of irregularity and erythema posterior to the left ureteral orifice about 3.5x3.5cm resected in its entirety with excellent hemostasis.  Number of tumors:        1 Size of largest tumor:          4cm  Characteristics of tumors:     Flat  Recurrent   Yes             Suspicious for Carcinoma in situ:    No  Clinical tumor stage:       cTa     Bimanual exam under anesthesia:        Yes - no mass  Visually complete resection:                 Yes  Visualization of detrusor muscle in resection base:        Yes  Visual evaluation for perforation:             Performed, no evidence of perforation   Indication:  Trevor David is a 78 y.o. male with a history of bladder tumor. All the risks, benefits were discussed with the patient to include but not limited to infection, pain, bleeding, damage to adjacent structures, need for further operations, adverse reaction to anesthesia and death.  Patient understands these risks and agrees to proceed with the operation as planned.    Description of procedure: After informed consent was obtained from the patient, the patient was taken to the operating room. General anesthesia was administered. The patient was placed in dorsal lithotomy position and prepped and draped in usual sterile fashion. Sequential compression devices were applied to lower extremities at the beginning of the case for  DVT prophylaxis. Antibiotics were infused prior to surgery start time. A surgical time-out was performed to properly identify the patient, the surgery to be performed, and the surgical site.     We then passed the 21-French rigid cystoscope down the urethra and into the bladder under direct vision without any difficulty. The anterior urethral was normal. The prostate was non-obstructing. The bladder was inspected with 30 and 70 degree lenses. Once in the bladder, systematic evaluation of bladder revealed irregularity and erythema on the left lateral wall posterior to the left ureteral orifice. The ureteral orfices were in orthotopic position and not involved.   We then removed the cystoscope and then passed down the 26 French resectoscope sheath down the urethra into the bladder under direct vision with the visual obturator. The tumor was resected down to muscle. The TUR bladder tumor chips were retrieved from the bladder and each region of resection was passed off the field as a separate specimen.  Hemostasis was achieved using electrocautery. We then proceeded with removing the resectoscope and then placed in a Foley catheter.  The patient tolerated the procedure well with no complication and was awoken from anesthesia and taken to recovery in stable condition.  While in the post-operative care unit, as a separate procedure, 2000 mg of gemcitabine in 50 mL of water was instilled in the bladder through the catheter and the catheter was plugged. This will remain indwelling for approximately one hour. It will then be drained from the bladder and the catheter will be removed and the patient discharged home.  Plan:  F/u in office to review pathology.  Matt R. Destaney Sarkis MD [facility]  Pager: [phone]

## 2024-03-16 LAB — SURGICAL PATHOLOGY

## 2024-03-27 DIAGNOSIS — D414 Neoplasm of uncertain behavior of bladder: Secondary | ICD-10-CM | POA: Diagnosis not present

## 2024-03-27 DIAGNOSIS — Z125 Encounter for screening for malignant neoplasm of prostate: Secondary | ICD-10-CM | POA: Diagnosis not present

## 2024-05-16 DIAGNOSIS — D414 Neoplasm of uncertain behavior of bladder: Secondary | ICD-10-CM | POA: Diagnosis not present

## 2024-05-23 DIAGNOSIS — Z5111 Encounter for antineoplastic chemotherapy: Secondary | ICD-10-CM | POA: Diagnosis not present

## 2024-05-23 DIAGNOSIS — D414 Neoplasm of uncertain behavior of bladder: Secondary | ICD-10-CM | POA: Diagnosis not present

## 2024-05-30 DIAGNOSIS — D414 Neoplasm of uncertain behavior of bladder: Secondary | ICD-10-CM | POA: Diagnosis not present

## 2024-06-06 DIAGNOSIS — D414 Neoplasm of uncertain behavior of bladder: Secondary | ICD-10-CM | POA: Diagnosis not present

## 2024-06-13 DIAGNOSIS — D414 Neoplasm of uncertain behavior of bladder: Secondary | ICD-10-CM | POA: Diagnosis not present

## 2024-06-20 DIAGNOSIS — D414 Neoplasm of uncertain behavior of bladder: Secondary | ICD-10-CM | POA: Diagnosis not present

## 2024-06-22 DIAGNOSIS — Z0000 Encounter for general adult medical examination without abnormal findings: Secondary | ICD-10-CM | POA: Diagnosis not present

## 2024-06-22 DIAGNOSIS — Z720 Tobacco use: Secondary | ICD-10-CM | POA: Diagnosis not present

## 2024-06-22 DIAGNOSIS — M109 Gout, unspecified: Secondary | ICD-10-CM | POA: Diagnosis not present

## 2024-06-22 DIAGNOSIS — E7800 Pure hypercholesterolemia, unspecified: Secondary | ICD-10-CM | POA: Diagnosis not present

## 2024-06-22 DIAGNOSIS — F3131 Bipolar disorder, current episode depressed, mild: Secondary | ICD-10-CM | POA: Diagnosis not present

## 2024-06-22 DIAGNOSIS — E1169 Type 2 diabetes mellitus with other specified complication: Secondary | ICD-10-CM | POA: Diagnosis not present

## 2024-06-22 DIAGNOSIS — I10 Essential (primary) hypertension: Secondary | ICD-10-CM | POA: Diagnosis not present

## 2024-06-22 DIAGNOSIS — C679 Malignant neoplasm of bladder, unspecified: Secondary | ICD-10-CM | POA: Diagnosis not present

## 2024-06-22 DIAGNOSIS — Z125 Encounter for screening for malignant neoplasm of prostate: Secondary | ICD-10-CM | POA: Diagnosis not present

## 2024-07-24 DIAGNOSIS — H401131 Primary open-angle glaucoma, bilateral, mild stage: Secondary | ICD-10-CM | POA: Diagnosis not present

## 2024-07-24 DIAGNOSIS — H4010X1 Unspecified open-angle glaucoma, mild stage: Secondary | ICD-10-CM | POA: Diagnosis not present

## 2024-08-01 DIAGNOSIS — D414 Neoplasm of uncertain behavior of bladder: Secondary | ICD-10-CM | POA: Diagnosis not present

## 2024-08-01 DIAGNOSIS — R8289 Other abnormal findings on cytological and histological examination of urine: Secondary | ICD-10-CM | POA: Diagnosis not present

## 2024-10-03 DIAGNOSIS — C679 Malignant neoplasm of bladder, unspecified: Secondary | ICD-10-CM | POA: Diagnosis not present

## 2024-10-10 DIAGNOSIS — R399 Unspecified symptoms and signs involving the genitourinary system: Secondary | ICD-10-CM | POA: Diagnosis not present

## 2024-10-10 DIAGNOSIS — C679 Malignant neoplasm of bladder, unspecified: Secondary | ICD-10-CM | POA: Diagnosis not present

## 2024-10-17 DIAGNOSIS — R399 Unspecified symptoms and signs involving the genitourinary system: Secondary | ICD-10-CM | POA: Diagnosis not present

## 2024-10-17 DIAGNOSIS — C679 Malignant neoplasm of bladder, unspecified: Secondary | ICD-10-CM | POA: Diagnosis not present
